# Patient Record
Sex: Female | Born: 1945 | Race: White | Hispanic: No | Marital: Married | Smoking: Never smoker
Health system: Southern US, Community
[De-identification: ages and names within clinical notes are randomized; demographics above are authoritative.]

## PROBLEM LIST (undated history)

## (undated) DIAGNOSIS — E7800 Pure hypercholesterolemia, unspecified: Secondary | ICD-10-CM

## (undated) DIAGNOSIS — M4300 Spondylolysis, site unspecified: Secondary | ICD-10-CM

## (undated) DIAGNOSIS — G96191 Perineural cyst: Secondary | ICD-10-CM

## (undated) DIAGNOSIS — E039 Hypothyroidism, unspecified: Secondary | ICD-10-CM

## (undated) DIAGNOSIS — M8580 Other specified disorders of bone density and structure, unspecified site: Secondary | ICD-10-CM

## (undated) DIAGNOSIS — E669 Obesity, unspecified: Secondary | ICD-10-CM

## (undated) DIAGNOSIS — R7303 Prediabetes: Secondary | ICD-10-CM

## (undated) DIAGNOSIS — N281 Cyst of kidney, acquired: Secondary | ICD-10-CM

## (undated) DIAGNOSIS — Z872 Personal history of diseases of the skin and subcutaneous tissue: Secondary | ICD-10-CM

## (undated) HISTORY — PX: APPENDECTOMY: SHX54

## (undated) HISTORY — PX: CYSTECTOMY: SUR359

## (undated) HISTORY — PX: ABDOMINAL HYSTERECTOMY: SHX81

---

## 2005-04-02 ENCOUNTER — Ambulatory Visit: Payer: Self-pay | Admitting: Internal Medicine

## 2006-04-06 ENCOUNTER — Ambulatory Visit: Payer: Self-pay | Admitting: Internal Medicine

## 2007-04-11 ENCOUNTER — Ambulatory Visit: Payer: Self-pay | Admitting: Internal Medicine

## 2007-09-28 ENCOUNTER — Ambulatory Visit: Payer: Self-pay | Admitting: Gastroenterology

## 2008-06-20 ENCOUNTER — Ambulatory Visit: Payer: Self-pay | Admitting: Internal Medicine

## 2009-11-25 ENCOUNTER — Ambulatory Visit: Payer: Self-pay | Admitting: Internal Medicine

## 2010-12-31 ENCOUNTER — Ambulatory Visit: Payer: Self-pay | Admitting: Internal Medicine

## 2012-02-16 ENCOUNTER — Ambulatory Visit: Payer: Self-pay | Admitting: Internal Medicine

## 2013-02-16 ENCOUNTER — Ambulatory Visit: Payer: Self-pay | Admitting: Internal Medicine

## 2013-11-13 ENCOUNTER — Ambulatory Visit: Payer: Self-pay

## 2014-04-03 ENCOUNTER — Ambulatory Visit: Payer: Self-pay | Admitting: Internal Medicine

## 2014-06-20 ENCOUNTER — Other Ambulatory Visit: Payer: Self-pay | Admitting: Internal Medicine

## 2014-06-20 DIAGNOSIS — Z1231 Encounter for screening mammogram for malignant neoplasm of breast: Secondary | ICD-10-CM

## 2015-04-08 ENCOUNTER — Ambulatory Visit: Payer: Self-pay | Attending: Internal Medicine

## 2015-10-15 ENCOUNTER — Other Ambulatory Visit: Payer: Self-pay | Admitting: Internal Medicine

## 2015-10-15 DIAGNOSIS — Z1231 Encounter for screening mammogram for malignant neoplasm of breast: Secondary | ICD-10-CM

## 2015-10-24 ENCOUNTER — Ambulatory Visit
Admission: RE | Admit: 2015-10-24 | Discharge: 2015-10-24 | Disposition: A | Discharge status: Home / Self Care | Payer: Medicare Other | Source: Ambulatory Visit | Attending: Internal Medicine | Admitting: Internal Medicine

## 2015-10-24 ENCOUNTER — Other Ambulatory Visit: Payer: Self-pay | Admitting: Internal Medicine

## 2015-10-24 DIAGNOSIS — Z1231 Encounter for screening mammogram for malignant neoplasm of breast: Secondary | ICD-10-CM

## 2016-10-15 ENCOUNTER — Other Ambulatory Visit: Payer: Self-pay | Admitting: Internal Medicine

## 2016-10-15 DIAGNOSIS — Z1231 Encounter for screening mammogram for malignant neoplasm of breast: Secondary | ICD-10-CM

## 2016-11-10 ENCOUNTER — Encounter (INDEPENDENT_AMBULATORY_CARE_PROVIDER_SITE_OTHER): Payer: Self-pay | Admitting: Vascular Surgery

## 2016-11-10 ENCOUNTER — Ambulatory Visit (INDEPENDENT_AMBULATORY_CARE_PROVIDER_SITE_OTHER): Payer: Medicare Other | Admitting: Vascular Surgery

## 2016-11-10 VITALS — BP 115/62 | HR 56 | Resp 16 | Ht 65.0 in | Wt 143.4 lb

## 2016-11-10 DIAGNOSIS — M7989 Other specified soft tissue disorders: Secondary | ICD-10-CM | POA: Diagnosis not present

## 2016-11-10 DIAGNOSIS — I83813 Varicose veins of bilateral lower extremities with pain: Secondary | ICD-10-CM

## 2016-11-10 NOTE — Progress Notes (Signed)
Patient ID: Janice Webb, female   DOB: August 18, 1945, 71 y.o.   MRN: [id number]  Chief Complaint  Patient presents with  . Varicose Veins    HPI Janice Webb is a 71 y.o. female.  I am asked to see the patient by Dr. Caryl Comes for evaluation of varicose veins.  The patient presents with complaints of symptomatic varicosities of the lower extremities. The patient reports a long standing history of varicosities and they have become painful over time. There was no clear inciting event or causative factor that started the symptoms.  The right leg is more severly affected. The patient elevates the legs for relief. The pain is described as Stinging and burning particularly overlying the varicosities. The symptoms are generally most severe in the evening, particularly when they have been on their feet for long periods of time. Elevation has been used to try to improve the symptoms with limited success. The patient complains of increasingly noticeable swelling as an associated symptom. The patient has no previous history of deep venous thrombosis or superficial thrombophlebitis to their knowledge.     History reviewed. No pertinent past medical history.  Past Surgical History:  Procedure Laterality Date  . ABDOMINAL HYSTERECTOMY    . APPENDECTOMY    . CYSTECTOMY      Family History  Problem Relation Age of Onset  . Breast cancer Paternal Aunt 63  Father and sister with varicose veins  Social History Social History  Substance Use Topics  . Smoking status: Never Smoker  . Smokeless tobacco: Never Used  . Alcohol use No  No IV drug use  No Known Allergies  Current Outpatient Prescriptions  Medication Sig Dispense Refill  . Calcium Carbonate-Vitamin D 600-400 MG-UNIT tablet Take by mouth.    . Multiple Vitamin (MULTI-VITAMINS) TABS Take by mouth.     No current facility-administered medications for this visit.       REVIEW OF SYSTEMS (Negative unless  checked)  Constitutional: [] Weight loss  [] Fever  [] Chills Cardiac: [] Chest pain   [] Chest pressure   [] Palpitations   [] Shortness of breath when laying flat   [] Shortness of breath at rest   [] Shortness of breath with exertion. Vascular:  [] Pain in legs with walking   [] Pain in legs at rest   [] Pain in legs when laying flat   [] Claudication   [] Pain in feet when walking  [] Pain in feet at rest  [] Pain in feet when laying flat   [] History of DVT   [] Phlebitis   [x] Swelling in legs   [x] Varicose veins   [] Non-healing ulcers Pulmonary:   [] Uses home oxygen   [] Productive cough   [] Hemoptysis   [] Wheeze  [] COPD   [] Asthma Neurologic:  [] Dizziness  [] Blackouts   [] Seizures   [] History of stroke   [] History of TIA  [] Aphasia   [] Temporary blindness   [] Dysphagia   [] Weakness or numbness in arms   [] Weakness or numbness in legs Musculoskeletal:  [] Arthritis   [] Joint swelling   [] Joint pain   [] Low back pain Hematologic:  [] Easy bruising  [] Easy bleeding   [] Hypercoagulable state   [] Anemic  [] Hepatitis Gastrointestinal:  [] Blood in stool   [] Vomiting blood  [] Gastroesophageal reflux/heartburn   [] Abdominal pain Genitourinary:  [] Chronic kidney disease   [] Difficult urination  [] Frequent urination  [] Burning with urination   [] Hematuria Skin:  [] Rashes   [] Ulcers   [] Wounds Psychological:  [] History of anxiety   []  History of major depression.    Physical Exam BP  115/62 (BP Location: Right Arm)   Pulse (!) 56   Resp 16   Ht 5\' 5"  (1.651 m)   Wt 65 kg (143 lb 6.4 oz)   BMI 23.86 kg/m  Gen:  WD/WN, NAD. Appears younger than stated age Head: [place]/AT, No temporalis wasting.  Ear/Nose/Throat: Hearing grossly intact, dentition good Eyes: Sclera non-icteric. Conjunctiva clear Neck: Supple, no nuchal rigidity. Trachea midline Pulmonary:  Good air movement, no use of accessory muscles, respirations not labored.  Cardiac: RRR, No JVD Vascular: Varicosities extensive and measuring up to 4 mm in the right  lower extremity        Varicosities diffuse and measuring up to 3 mm in the left lower extremity Vessel Right Left  Radial Palpable Palpable                          PT Palpable Palpable  DP Palpable Palpable    Musculoskeletal: M/S 5/5 throughout.   1 + RLE edema.  Trace LLE edema Neurologic: Sensation grossly intact in extremities.  Symmetrical.  Speech is fluent.  Psychiatric: Judgment intact, Mood & affect appropriate for pt's clinical situation. Dermatologic: No rashes or ulcers noted.  No cellulitis or open wounds.    Radiology No results found.  Labs No results found for this or any previous visit (from the past 2160 hour(s)).  Assessment/Plan:  Swelling of limb Likely from venous disease  Varicose veins of leg with pain, bilateral   The patient has symptoms consistent with chronic venous insufficiency. We discussed the natural history and treatment options for venous disease. I recommended the regular use of 20 - 30 mm Hg compression stockings, and prescribed these today. I recommended leg elevation and anti-inflammatories as needed for pain. I have also recommended a complete venous duplex to assess the venous system for reflux or thrombotic issues. This can be done at the patient's convenience. I will see the patient back in 3 months to assess the response to conservative management, and determine further treatment options.     Leotis Pain 11/10/2016, 1:58 PM   This note was created with Dragon medical transcription system.  Any errors from dictation are unintentional.

## 2016-11-10 NOTE — Assessment & Plan Note (Signed)
Likely from venous disease 

## 2016-11-10 NOTE — Patient Instructions (Signed)
Varicose Veins Varicose veins are veins that have become enlarged and twisted. They are usually seen in the legs but can occur in other parts of the body as well. What are the causes? This condition is the result of valves in the veins not working properly. Valves in the veins help to return blood from the leg to the heart. If these valves are damaged, blood flows backward and backs up into the veins in the leg near the skin. This causes the veins to become larger. What increases the risk? People who are on their feet a lot, who are pregnant, or who are overweight are more likely to develop varicose veins. What are the signs or symptoms?  Bulging, twisted-appearing, bluish veins, most commonly found on the legs.  Leg pain or a feeling of heaviness. These symptoms may be worse at the end of the day.  Leg swelling.  Changes in skin color. How is this diagnosed? A health care provider can usually diagnose varicose veins by examining your legs. Your health care provider may also recommend an ultrasound of your leg veins. How is this treated? Most varicose veins can be treated at home.However, other treatments are available for people who have persistent symptoms or want to improve the cosmetic appearance of the varicose veins. These treatment options include:  Sclerotherapy. A solution is injected into the vein to close it off.  Laser treatment. A laser is used to heat the vein to close it off.  Radiofrequency vein ablation. An electrical current produced by radio waves is used to close off the vein.  Phlebectomy. The vein is surgically removed through small incisions made over the varicose vein.  Vein ligation and stripping. The vein is surgically removed through incisions made over the varicose vein after the vein has been tied (ligated). Follow these instructions at home:   Do not stand or sit in one position for long periods of time. Do not sit with your legs crossed. Rest with your  legs raised during the day.  Wear compression stockings as directed by your health care provider. These stockings help to prevent blood clots and reduce swelling in your legs.  Do not wear other tight, encircling garments around your legs, pelvis, or waist.  Walk as much as possible to increase blood flow.  Raise the foot of your bed at night with 2-inch blocks.  If you get a cut in the skin over the vein and the vein bleeds, lie down with your leg raised and press on it with a clean cloth until the bleeding stops. Then place a bandage (dressing) on the cut. See your health care provider if it continues to bleed. Contact a health care provider if:  The skin around your ankle starts to break down.  You have pain, redness, tenderness, or hard swelling in your leg over a vein.  You are uncomfortable because of leg pain. This information is not intended to replace advice given to you by your health care provider. Make sure you discuss any questions you have with your health care provider. Document Released: 11/05/2004 Document Revised: 07/04/2015 Document Reviewed: 07/30/2015 Elsevier Interactive Patient Education  2017 [employer]  

## 2016-11-25 ENCOUNTER — Ambulatory Visit
Admission: RE | Admit: 2016-11-25 | Discharge: 2016-11-25 | Disposition: A | Discharge status: Home / Self Care | Payer: Medicare Other | Source: Ambulatory Visit | Attending: Internal Medicine | Admitting: Internal Medicine

## 2016-11-25 DIAGNOSIS — Z1231 Encounter for screening mammogram for malignant neoplasm of breast: Secondary | ICD-10-CM | POA: Diagnosis not present

## 2017-02-17 ENCOUNTER — Encounter (INDEPENDENT_AMBULATORY_CARE_PROVIDER_SITE_OTHER): Payer: Self-pay | Admitting: Vascular Surgery

## 2017-02-17 ENCOUNTER — Ambulatory Visit (INDEPENDENT_AMBULATORY_CARE_PROVIDER_SITE_OTHER): Payer: Medicare Other

## 2017-02-17 ENCOUNTER — Ambulatory Visit (INDEPENDENT_AMBULATORY_CARE_PROVIDER_SITE_OTHER): Payer: Medicare Other | Admitting: Vascular Surgery

## 2017-02-17 VITALS — BP 147/55 | HR 60 | Resp 14 | Ht 65.0 in | Wt 145.0 lb

## 2017-02-17 DIAGNOSIS — I83813 Varicose veins of bilateral lower extremities with pain: Secondary | ICD-10-CM

## 2017-02-17 DIAGNOSIS — I890 Lymphedema, not elsewhere classified: Secondary | ICD-10-CM

## 2017-02-17 DIAGNOSIS — I872 Venous insufficiency (chronic) (peripheral): Secondary | ICD-10-CM | POA: Diagnosis not present

## 2017-02-17 NOTE — Progress Notes (Signed)
Subjective:    Patient ID: Janice Webb, female    DOB: 09-22-1945, 72 y.o.   MRN: [id number] Chief Complaint  Patient presents with  . Follow-up    3 month venous reflux   The patient presents for a 73-month follow-up in regard to her bilateral painful varicosities.  Number initial visit, the patient has been engaging in conservative therapy including wearing medical grade 1 compression stockings, elevating her legs and remaining active with minimal improvement in her symptoms requiring the use of over-the-counter anti-inflammatories.  The patient's symptoms have progressed to the point she is unable to function on a daily basis.  She feels that her symptoms are lifestyle limiting.  The patient underwent a bilateral lower extremity venous reflux exam which was notable for right lower extremity: Reflux noted in the great saphenous vein at the knee.  Left lower extremity: Reflux noted in the great saphenous vein at the groin, great saphenous vein at the knee and small saphenous vein at the level of the saphenous popliteal junction.  No evidence of deep vein or superficial vein thrombophlebitis.  The patient denies any fever, nausea or vomiting.   Review of Systems  Constitutional: Negative.   HENT: Negative.   Eyes: Negative.   Respiratory: Negative.   Cardiovascular:       Painful varicose veins Chronic venous insufficiency  Gastrointestinal: Negative.   Endocrine: Negative.   Genitourinary: Negative.   Musculoskeletal: Negative.   Skin: Negative.   Allergic/Immunologic: Negative.   Neurological: Negative.   Hematological: Negative.   Psychiatric/Behavioral: Negative.       Objective:   Physical Exam  Constitutional: She is oriented to person, place, and time. She appears well-developed and well-nourished. No distress.  HENT:  Head: Normocephalic and atraumatic.  Eyes: Conjunctivae are normal. Pupils are equal, round, and reactive to light.  Neck: Normal range of  motion.  Cardiovascular: Normal rate, regular rhythm, normal heart sounds and intact distal pulses.  Pulses:      Radial pulses are 2+ on the right side, and 2+ on the left side.       Dorsalis pedis pulses are 2+ on the right side, and 2+ on the left side.       Posterior tibial pulses are 2+ on the right side, and 2+ on the left side.  Pulmonary/Chest: Effort normal and breath sounds normal.  Musculoskeletal: Normal range of motion. She exhibits edema (Mild to moderate bilateral lower extremity edema noted.  Non-pitting).  Neurological: She is alert and oriented to person, place, and time.  Skin: She is not diaphoretic.  Mixture of greater than 1 cm and less than 1 cm scattered varicosities to the bilateral lower extremity.  There is no cellulitis.  There is no skin changes.  Psychiatric: She has a normal mood and affect. Her behavior is normal. Judgment and thought content normal.  Vitals reviewed.  BP (!) 147/55 (BP Location: Right Arm, Patient Position: Sitting)   Pulse 60   Resp 14   Ht 5\' 5"  (1.651 m)   Wt 145 lb (65.8 kg)   BMI 24.13 kg/m   No past medical history on file.  Social History   Socioeconomic History  . Marital status: Married    Spouse name: Not on file  . Number of children: Not on file  . Years of education: Not on file  . Highest education level: Not on file  Social Needs  . Financial resource strain: Not on file  . Food insecurity -  worry: Not on file  . Food insecurity - inability: Not on file  . Transportation needs - medical: Not on file  . Transportation needs - non-medical: Not on file  Occupational History  . Not on file  Tobacco Use  . Smoking status: Never Smoker  . Smokeless tobacco: Never Used  Substance and Sexual Activity  . Alcohol use: No  . Drug use: No  . Sexual activity: Not on file  Other Topics Concern  . Not on file  Social History Narrative  . Not on file   Past Surgical History:  Procedure Laterality Date  .  ABDOMINAL HYSTERECTOMY    . APPENDECTOMY    . CYSTECTOMY     Family History  Problem Relation Age of Onset  . Breast cancer Paternal Aunt 52   No Known Allergies     Assessment & Plan:  The patient presents for a 73-month follow-up in regard to her bilateral painful varicosities.  Since our initial visit, the patient has been engaging in conservative therapy including wearing medical grade 1 compression stockings, elevating her legs and remaining active with minimal improvement in her symptoms requiring the use of over-the-counter anti-inflammatories.  The patient's symptoms have progressed to the point she is unable to function on a daily basis.  She feels that her symptoms are lifestyle limiting.  The patient underwent a bilateral lower extremity venous reflux exam which was notable for right lower extremity: Reflux noted in the great saphenous vein at the knee.  Left lower extremity: Reflux noted in the great saphenous vein at the groin, great saphenous vein at the knee and small saphenous vein at the level of the saphenous popliteal junction.  No evidence of deep vein or superficial vein thrombophlebitis.  The patient denies any fever, nausea or vomiting.  1. Chronic venous insufficiency - New  Since our initial visit, the patient has been engaging in conservative therapy including wearing medical grade 1 compression stockings, elevating her legs and remaining active with minimal improvement in her symptoms requiring the use of over-the-counter anti-inflammatories.  She feels that her symptoms are lifestyle limiting.  The patient underwent a bilateral lower extremity venous reflux exam which was notable for right lower extremity: Reflux noted in the great saphenous vein at the knee.  Left lower extremity: Reflux noted in the great saphenous vein at the groin, great saphenous vein at the knee and small saphenous vein at the level of the saphenous popliteal junction.  The patient is likely to  benefit from endovenous laser ablation. I have discussed the risks and benefits of the procedure. The risks primarily include DVT, recanalization, bleeding, infection, and inability to gain access. I will applied to the patient's insurance The patient is to continue engaging in conservative therapy until I receive insurance approval.  2. Varicose veins of leg with pain, bilateral - Stable As above  3. Lymphedema - New The patient may be a candidate for a lymphedema pump in the future if conservative therapy and laser ablation does not improve her symptoms  Current Outpatient Medications on File Prior to Visit  Medication Sig Dispense Refill  . Calcium Carbonate-Vitamin D 600-400 MG-UNIT tablet Take by mouth.    . Multiple Vitamin (MULTI-VITAMINS) TABS Take by mouth.     No current facility-administered medications on file prior to visit.     There are no Patient Instructions on file for this visit. No Follow-up on file.   Shauntay Brunelli A Angelize Ryce, PA-C

## 2017-04-30 ENCOUNTER — Encounter (INDEPENDENT_AMBULATORY_CARE_PROVIDER_SITE_OTHER): Payer: Self-pay | Admitting: Vascular Surgery

## 2017-04-30 ENCOUNTER — Ambulatory Visit (INDEPENDENT_AMBULATORY_CARE_PROVIDER_SITE_OTHER): Payer: Medicare Other | Admitting: Vascular Surgery

## 2017-04-30 VITALS — BP 118/47 | HR 54 | Resp 16 | Ht 65.5 in | Wt 144.6 lb

## 2017-04-30 DIAGNOSIS — I83813 Varicose veins of bilateral lower extremities with pain: Secondary | ICD-10-CM

## 2017-04-30 NOTE — Progress Notes (Signed)
Varicose veins of leg with pain, bilateral     The patient's left lower extremity was sterilely prepped and draped. The ultrasound machine was used to visualize the saphenous vein throughout its course. A segment in the upper calf was selected for access. The saphenous vein was accessed without difficulty using ultrasound guidance with a micro puncture needle. A micro puncture wire and sheath were then placed. A 0.018 wire was placed beyond the saphenofemoral junction through the sheath and the micro puncture sheath was removed. The 65 cm sheath was then placed over the wire and the wire and dilator were removed. The laser fiber was placed through the sheath and its tip was placed approximately 4-5 cm below the saphenofemoral junction. Tumescent anesthesia was then created with a dilute lidocaine solution. Laser energy was then delivered with constant withdrawal of the sheath and laser fiber. Approximately 1356 Joules of energy were delivered over a length of 37 cm using the 1470 Hz VenaCure machine at [employer]. The SSV was visualized for possible ablation, but it was very small and not large enough for access or placement of a sheath, so this was not performed. Sterile dressings were placed. The patient tolerated the procedure well without complications.

## 2017-05-04 ENCOUNTER — Ambulatory Visit (INDEPENDENT_AMBULATORY_CARE_PROVIDER_SITE_OTHER): Payer: Medicare Other

## 2017-05-04 DIAGNOSIS — I83813 Varicose veins of bilateral lower extremities with pain: Secondary | ICD-10-CM | POA: Diagnosis not present

## 2017-05-21 ENCOUNTER — Other Ambulatory Visit (INDEPENDENT_AMBULATORY_CARE_PROVIDER_SITE_OTHER): Payer: Medicare Other | Admitting: Vascular Surgery

## 2017-05-25 ENCOUNTER — Encounter (INDEPENDENT_AMBULATORY_CARE_PROVIDER_SITE_OTHER): Payer: Medicare Other

## 2017-06-17 ENCOUNTER — Other Ambulatory Visit (INDEPENDENT_AMBULATORY_CARE_PROVIDER_SITE_OTHER): Payer: Self-pay | Admitting: Vascular Surgery

## 2017-06-17 DIAGNOSIS — I8393 Asymptomatic varicose veins of bilateral lower extremities: Secondary | ICD-10-CM

## 2017-06-18 ENCOUNTER — Ambulatory Visit (INDEPENDENT_AMBULATORY_CARE_PROVIDER_SITE_OTHER): Payer: Medicare Other | Admitting: Vascular Surgery

## 2017-06-18 ENCOUNTER — Encounter (INDEPENDENT_AMBULATORY_CARE_PROVIDER_SITE_OTHER): Payer: Self-pay | Admitting: Vascular Surgery

## 2017-06-18 VITALS — BP 106/57 | HR 54 | Resp 13 | Ht 65.0 in | Wt 146.0 lb

## 2017-06-18 DIAGNOSIS — I83812 Varicose veins of left lower extremities with pain: Secondary | ICD-10-CM | POA: Diagnosis not present

## 2017-06-18 DIAGNOSIS — I83813 Varicose veins of bilateral lower extremities with pain: Secondary | ICD-10-CM

## 2017-06-18 DIAGNOSIS — I83811 Varicose veins of right lower extremities with pain: Secondary | ICD-10-CM | POA: Diagnosis not present

## 2017-06-18 NOTE — Progress Notes (Signed)
Varicose veins of leg with pain, bilateral    The patient's right lower extremity was sterilely prepped and draped. The ultrasound machine was used to visualize the saphenous vein throughout its course. A segment in the upper calf was selected for access. The saphenous vein was accessed without difficulty using ultrasound guidance with a micro puncture needle. A micro puncture wire and sheath were then placed. A 0.018 wire was placed beyond the saphenofemoral junction through the sheath and the micro puncture sheath was removed. The 65 cm sheath was then placed over the wire and the wire and dilator were removed. The laser fiber was placed through the sheath and its tip was placed approximately 4 cm below the saphenofemoral junction. Tumescent anesthesia was then created with a dilute lidocaine solution. Laser energy was then delivered with constant withdrawal of the sheath and laser fiber. Approximately 1519 Joules of energy were delivered over a length of 41 cm using the 1470 Hz VenaCure machine at [employer]. Sterile dressings were placed. The patient tolerated the procedure well without complications.

## 2017-06-21 ENCOUNTER — Ambulatory Visit (INDEPENDENT_AMBULATORY_CARE_PROVIDER_SITE_OTHER): Payer: Medicare Other

## 2017-06-21 DIAGNOSIS — I8393 Asymptomatic varicose veins of bilateral lower extremities: Secondary | ICD-10-CM | POA: Diagnosis not present

## 2017-07-16 ENCOUNTER — Ambulatory Visit (INDEPENDENT_AMBULATORY_CARE_PROVIDER_SITE_OTHER): Payer: Medicare Other | Admitting: Vascular Surgery

## 2017-07-16 ENCOUNTER — Encounter (INDEPENDENT_AMBULATORY_CARE_PROVIDER_SITE_OTHER): Payer: Self-pay | Admitting: Vascular Surgery

## 2017-07-16 VITALS — BP 110/61 | HR 60 | Resp 16 | Ht 65.0 in | Wt 148.8 lb

## 2017-07-16 DIAGNOSIS — I83813 Varicose veins of bilateral lower extremities with pain: Secondary | ICD-10-CM | POA: Diagnosis not present

## 2017-07-16 DIAGNOSIS — I872 Venous insufficiency (chronic) (peripheral): Secondary | ICD-10-CM

## 2017-07-16 DIAGNOSIS — I890 Lymphedema, not elsewhere classified: Secondary | ICD-10-CM | POA: Diagnosis not present

## 2017-07-16 NOTE — Progress Notes (Signed)
Subjective:    Patient ID: Janice Webb, female    DOB: Jul 01, 1945, 72 y.o.   MRN: [id number] Chief Complaint  Patient presents with  . Follow-up    3-4wk post laser   The patient is now status post laser ablation to the bilateral great saphenous veins.  Both follow-up ultrasounds are notable for successfully ablated great saphenous veins.  The patient continues to engage in conservative therapy including wearing medical grade 1 compression socks, elevating her legs and remaining active.  The patient notes that the remaining varicosities noted to the bilateral legs continued to be painful.  Patient notes that her symptoms have progressed to the point that she is unable to function on a daily basis and feel that her symptoms have become lifestyle limiting.  Patient denies any claudication-like symptoms, rest pain or ulcerations of bilateral lower extremity. Patient denies any fever, nausea vomiting.  Review of Systems  Constitutional: Negative.   HENT: Negative.   Eyes: Negative.   Respiratory: Negative.   Cardiovascular:       Painful varicose veins  Gastrointestinal: Negative.   Endocrine: Negative.   Genitourinary: Negative.   Musculoskeletal: Negative.   Skin: Negative.   Allergic/Immunologic: Negative.   Neurological: Negative.   Hematological: Negative.   Psychiatric/Behavioral: Negative.       Objective:   Physical Exam  Constitutional: She is oriented to person, place, and time. She appears well-developed and well-nourished. No distress.  HENT:  Head: Normocephalic.  Right Ear: External ear normal.  Left Ear: External ear normal.  Eyes: Pupils are equal, round, and reactive to light. Conjunctivae and EOM are normal.  Neck: Normal range of motion.  Cardiovascular: Normal rate, regular rhythm, normal heart sounds and intact distal pulses.  Pulses:      Radial pulses are 2+ on the right side, and 2+ on the left side.       Dorsalis pedis pulses are 2+ on the  right side, and 2+ on the left side.       Posterior tibial pulses are 2+ on the right side, and 2+ on the left side.  Pulmonary/Chest: Effort normal and breath sounds normal.  Musculoskeletal: Normal range of motion.  Neurological: She is alert and oriented to person, place, and time.  Skin: Skin is warm and dry. She is not diaphoretic.  Diffuse greater than 1 cm less than 1 cm scattered varicosities noted to the bilateral legs.  Psychiatric: She has a normal mood and affect. Her behavior is normal. Judgment and thought content normal.  Vitals reviewed.  BP 110/61 (BP Location: Right Arm)   Pulse 60   Resp 16   Ht 5\' 5"  (1.651 m)   Wt 148 lb 12.8 oz (67.5 kg)   BMI 24.76 kg/m   No past medical history on file.  Social History   Socioeconomic History  . Marital status: Married    Spouse name: Not on file  . Number of children: Not on file  . Years of education: Not on file  . Highest education level: Not on file  Occupational History  . Not on file  Social Needs  . Financial resource strain: Not on file  . Food insecurity:    Worry: Not on file    Inability: Not on file  . Transportation needs:    Medical: Not on file    Non-medical: Not on file  Tobacco Use  . Smoking status: Never Smoker  . Smokeless tobacco: Never Used  Substance and Sexual  Activity  . Alcohol use: No  . Drug use: No  . Sexual activity: Not on file  Lifestyle  . Physical activity:    Days per week: Not on file    Minutes per session: Not on file  . Stress: Not on file  Relationships  . Social connections:    Talks on phone: Not on file    Gets together: Not on file    Attends religious service: Not on file    Active member of club or organization: Not on file    Attends meetings of clubs or organizations: Not on file    Relationship status: Not on file  . Intimate partner violence:    Fear of current or ex partner: Not on file    Emotionally abused: Not on file    Physically abused: Not  on file    Forced sexual activity: Not on file  Other Topics Concern  . Not on file  Social History Narrative  . Not on file   Past Surgical History:  Procedure Laterality Date  . ABDOMINAL HYSTERECTOMY    . APPENDECTOMY    . CYSTECTOMY     Family History  Problem Relation Age of Onset  . Breast cancer Paternal Aunt 61   No Known Allergies     Assessment & Plan:  The patient is now status post laser ablation to the bilateral great saphenous veins.  Both follow-up ultrasounds are notable for successfully ablated great saphenous veins.  The patient continues to engage in conservative therapy including wearing medical grade 1 compression socks, elevating her legs and remaining active.  The patient notes that the remaining varicosities noted to the bilateral legs continued to be painful.  Patient notes that her symptoms have progressed to the point that she is unable to function on a daily basis and feel that her symptoms have become lifestyle limiting.  Patient denies any claudication-like symptoms, rest pain or ulcerations of bilateral lower extremity. Patient denies any fever, nausea vomiting.  1. Chronic venous insufficiency - Stable The patient is status post bilateral great saphenous vein ablation Follow-up ultrasounds to the bilateral legs are notable for successfully ablated greater saphenous veins  2. Varicose veins of leg with pain, bilateral - Stable Evening the patient is status post bilateral lower extremity endovascular ablation to the great saphenous vein she is still experiencing painful residual varicosities. The patient has engage in conservative therapy for over 3 months including wearing medical grade 1 compression socks, elevating her legs and remaining active with minimal improvement in her symptoms. The patient feels that her symptoms have progressed to the point that she is unable to function this lifestyle limiting The patient would benefit from foam sclerotherapy  to the larger varicosities and saline sclerotherapy to the smaller ones I will apply to the patient's insurance  3. Lymphedema - Stable The patient may be a candidate for lymphedema pump in the future  Current Outpatient Medications on File Prior to Visit  Medication Sig Dispense Refill  . Calcium Carbonate-Vitamin D 600-400 MG-UNIT tablet Take by mouth.    . Multiple Vitamin (MULTI-VITAMINS) TABS Take by mouth.    . ALPRAZolam (XANAX) 0.5 MG tablet   0   No current facility-administered medications on file prior to visit.    There are no Patient Instructions on file for this visit. No follow-ups on file.  Damonie Ellenwood A Karissa Meenan, PA-C

## 2017-08-24 ENCOUNTER — Ambulatory Visit (INDEPENDENT_AMBULATORY_CARE_PROVIDER_SITE_OTHER): Payer: Medicare Other | Admitting: Vascular Surgery

## 2017-08-24 ENCOUNTER — Encounter (INDEPENDENT_AMBULATORY_CARE_PROVIDER_SITE_OTHER): Payer: Self-pay | Admitting: Vascular Surgery

## 2017-08-24 VITALS — BP 120/65 | HR 58 | Resp 14 | Ht 65.0 in | Wt 150.0 lb

## 2017-08-24 DIAGNOSIS — I83813 Varicose veins of bilateral lower extremities with pain: Secondary | ICD-10-CM

## 2017-08-24 DIAGNOSIS — I83812 Varicose veins of left lower extremities with pain: Secondary | ICD-10-CM | POA: Diagnosis not present

## 2017-08-24 NOTE — Progress Notes (Signed)
  Janice Webb is a 72 y.o.female who presents with painful varicose veins of the left leg  History reviewed. No pertinent past medical history.  Past Surgical History:  Procedure Laterality Date  . ABDOMINAL HYSTERECTOMY    . APPENDECTOMY    . CYSTECTOMY      Current Outpatient Medications  Medication Sig Dispense Refill  . ALPRAZolam (XANAX) 0.5 MG tablet   0  . Calcium Carbonate-Vitamin D 600-400 MG-UNIT tablet Take by mouth.    . Multiple Vitamin (MULTI-VITAMINS) TABS Take by mouth.     No current facility-administered medications for this visit.     No Known Allergies  Indication: Patient presents with symptomatic varicose veins of the left lower extremity.  Procedure: Foam sclerotherapy was performed on the left lower extremity. Using ultrasound guidance, 5 mL of foam Sotradecol was used to inject the varicosities of the left lower extremity. Compression wraps were placed. The patient tolerated the procedure well.

## 2017-09-14 ENCOUNTER — Encounter (INDEPENDENT_AMBULATORY_CARE_PROVIDER_SITE_OTHER): Payer: Self-pay | Admitting: Vascular Surgery

## 2017-09-14 ENCOUNTER — Ambulatory Visit (INDEPENDENT_AMBULATORY_CARE_PROVIDER_SITE_OTHER): Payer: Medicare Other | Admitting: Vascular Surgery

## 2017-09-14 VITALS — BP 124/72 | HR 67 | Resp 14 | Ht 66.0 in | Wt 152.0 lb

## 2017-09-14 DIAGNOSIS — I83811 Varicose veins of right lower extremities with pain: Secondary | ICD-10-CM

## 2017-09-14 DIAGNOSIS — I83813 Varicose veins of bilateral lower extremities with pain: Secondary | ICD-10-CM

## 2017-09-14 NOTE — Progress Notes (Signed)
Janice Webb is a 72 y.o.female who presents with painful varicose veins of the right leg  History reviewed. No pertinent past medical history.  Past Surgical History:  Procedure Laterality Date  . ABDOMINAL HYSTERECTOMY    . APPENDECTOMY    . CYSTECTOMY      Current Outpatient Medications  Medication Sig Dispense Refill  . ALPRAZolam (XANAX) 0.5 MG tablet   0  . Calcium Carbonate-Vitamin D 600-400 MG-UNIT tablet Take by mouth.    . Multiple Vitamin (MULTI-VITAMINS) TABS Take by mouth.     No current facility-administered medications for this visit.     No Known Allergies  Indication: Patient presents with symptomatic varicose veins of the right lower extremity.  Procedure: Foam sclerotherapy was performed on the right lower extremity. Using ultrasound guidance, 5 mL of foam Sotradecol was used to inject the varicosities of the right lower extremity. Compression wraps were placed. The patient tolerated the procedure well.

## 2017-10-12 ENCOUNTER — Encounter (INDEPENDENT_AMBULATORY_CARE_PROVIDER_SITE_OTHER): Payer: Self-pay | Admitting: Vascular Surgery

## 2017-10-12 ENCOUNTER — Ambulatory Visit (INDEPENDENT_AMBULATORY_CARE_PROVIDER_SITE_OTHER): Payer: Medicare Other | Admitting: Vascular Surgery

## 2017-10-12 VITALS — BP 122/61 | HR 72 | Resp 16 | Ht 66.0 in | Wt 152.6 lb

## 2017-10-12 DIAGNOSIS — I83811 Varicose veins of right lower extremities with pain: Secondary | ICD-10-CM

## 2017-10-12 DIAGNOSIS — I83813 Varicose veins of bilateral lower extremities with pain: Secondary | ICD-10-CM

## 2017-10-12 NOTE — Progress Notes (Signed)
Janice Webb is a 72 y.o.female who presents with painful varicose veins of the right leg  No past medical history on file.  Past Surgical History:  Procedure Laterality Date  . ABDOMINAL HYSTERECTOMY    . APPENDECTOMY    . CYSTECTOMY      Current Outpatient Medications  Medication Sig Dispense Refill  . ALPRAZolam (XANAX) 0.5 MG tablet   0  . Calcium Carbonate-Vitamin D 600-400 MG-UNIT tablet Take by mouth.    . Multiple Vitamin (MULTI-VITAMINS) TABS Take by mouth.     No current facility-administered medications for this visit.     No Known Allergies  Indication: Patient presents with symptomatic varicose veins of the right lower extremity.  Procedure: Foam sclerotherapy was performed on the right lower extremity. Using ultrasound guidance, 5 mL of foam Sotradecol was used to inject the varicosities of the right lower extremity. Compression wraps were placed. The patient tolerated the procedure well.

## 2017-10-19 ENCOUNTER — Other Ambulatory Visit: Payer: Self-pay | Admitting: Internal Medicine

## 2017-10-19 DIAGNOSIS — Z1231 Encounter for screening mammogram for malignant neoplasm of breast: Secondary | ICD-10-CM

## 2017-11-09 ENCOUNTER — Ambulatory Visit (INDEPENDENT_AMBULATORY_CARE_PROVIDER_SITE_OTHER): Payer: Medicare Other | Admitting: Vascular Surgery

## 2017-11-09 ENCOUNTER — Encounter (INDEPENDENT_AMBULATORY_CARE_PROVIDER_SITE_OTHER): Payer: Self-pay | Admitting: Vascular Surgery

## 2017-11-09 VITALS — BP 136/64 | HR 66 | Resp 18 | Ht 66.0 in | Wt 154.0 lb

## 2017-11-09 DIAGNOSIS — I83813 Varicose veins of bilateral lower extremities with pain: Secondary | ICD-10-CM | POA: Diagnosis not present

## 2017-11-09 DIAGNOSIS — I83893 Varicose veins of bilateral lower extremities with other complications: Secondary | ICD-10-CM

## 2017-11-09 NOTE — Progress Notes (Signed)
Varicose veins of bilateral  lower extremity with inflammation (454.1  I83.10) Current Plans   Indication: Patient presents with symptomatic varicose veins of the bilateral  lower extremity.   Procedure: Sclerotherapy using hypertonic saline mixed with 1% Lidocaine was performed on the bilateral lower extremity. Compression wraps were placed. The patient tolerated the procedure well. 

## 2017-11-29 ENCOUNTER — Ambulatory Visit
Admission: RE | Admit: 2017-11-29 | Discharge: 2017-11-29 | Disposition: A | Discharge status: Home / Self Care | Payer: Medicare Other | Source: Ambulatory Visit | Attending: Internal Medicine | Admitting: Internal Medicine

## 2017-11-29 DIAGNOSIS — Z1231 Encounter for screening mammogram for malignant neoplasm of breast: Secondary | ICD-10-CM | POA: Diagnosis present

## 2017-12-07 ENCOUNTER — Ambulatory Visit (INDEPENDENT_AMBULATORY_CARE_PROVIDER_SITE_OTHER): Payer: Medicare Other | Admitting: Vascular Surgery

## 2017-12-07 ENCOUNTER — Encounter (INDEPENDENT_AMBULATORY_CARE_PROVIDER_SITE_OTHER): Payer: Self-pay | Admitting: Vascular Surgery

## 2017-12-07 VITALS — BP 127/67 | HR 74 | Resp 16 | Ht 66.0 in | Wt 157.0 lb

## 2017-12-07 DIAGNOSIS — I83813 Varicose veins of bilateral lower extremities with pain: Secondary | ICD-10-CM | POA: Diagnosis not present

## 2017-12-07 NOTE — Progress Notes (Signed)
Varicose veins of bilateral  lower extremity with inflammation (454.1  I83.10) Current Plans   Indication: Patient presents with symptomatic varicose veins of the bilateral  lower extremity.   Procedure: Sclerotherapy using hypertonic saline mixed with 1% Lidocaine was performed on the bilateral lower extremity. Compression wraps were placed. The patient tolerated the procedure well. 

## 2018-01-11 ENCOUNTER — Encounter (INDEPENDENT_AMBULATORY_CARE_PROVIDER_SITE_OTHER): Payer: Self-pay | Admitting: Vascular Surgery

## 2018-01-11 ENCOUNTER — Ambulatory Visit (INDEPENDENT_AMBULATORY_CARE_PROVIDER_SITE_OTHER): Payer: Medicare Other | Admitting: Vascular Surgery

## 2018-01-11 VITALS — BP 122/71 | HR 64 | Resp 16 | Ht 66.0 in | Wt 154.0 lb

## 2018-01-11 DIAGNOSIS — I83812 Varicose veins of left lower extremities with pain: Secondary | ICD-10-CM

## 2018-01-11 DIAGNOSIS — I83811 Varicose veins of right lower extremities with pain: Secondary | ICD-10-CM

## 2018-01-11 DIAGNOSIS — I872 Venous insufficiency (chronic) (peripheral): Secondary | ICD-10-CM

## 2018-01-11 DIAGNOSIS — I83813 Varicose veins of bilateral lower extremities with pain: Secondary | ICD-10-CM

## 2018-01-11 NOTE — Progress Notes (Signed)
Varicose veins of bilateral  lower extremity with inflammation (454.1  I83.10) Current Plans   Indication: Patient presents with symptomatic varicose veins of the bilateral  lower extremity.   Procedure: Sclerotherapy using hypertonic saline mixed with 1% Lidocaine was performed on the bilateral lower extremity. Compression wraps were placed. The patient tolerated the procedure well. 

## 2018-01-12 ENCOUNTER — Ambulatory Visit (INDEPENDENT_AMBULATORY_CARE_PROVIDER_SITE_OTHER): Payer: Medicare Other | Admitting: Vascular Surgery

## 2018-01-29 ENCOUNTER — Ambulatory Visit
Admission: EM | Admit: 2018-01-29 | Discharge: 2018-01-29 | Disposition: A | Discharge status: Home / Self Care | Payer: Medicare Other | Attending: Family Medicine | Admitting: Family Medicine

## 2018-01-29 ENCOUNTER — Other Ambulatory Visit: Payer: Self-pay

## 2018-01-29 ENCOUNTER — Encounter: Payer: Self-pay | Admitting: Gynecology

## 2018-01-29 DIAGNOSIS — M25552 Pain in left hip: Secondary | ICD-10-CM

## 2018-01-29 DIAGNOSIS — M545 Low back pain, unspecified: Secondary | ICD-10-CM

## 2018-01-29 MED ORDER — PREDNISONE 10 MG PO TABS: ORAL_TABLET | ORAL | 0 refills | Status: DC

## 2018-01-29 MED ORDER — HYDROCODONE-ACETAMINOPHEN 5-325 MG PO TABS: 1.0000 | ORAL_TABLET | Freq: Three times a day (TID) | ORAL | 0 refills | Status: DC | PRN

## 2018-01-29 NOTE — ED Triage Notes (Signed)
Patient c/o pain in her left hip x 4 days. Per patient no injuries to her left hip.

## 2018-01-29 NOTE — Discharge Instructions (Signed)
Medication as prescribed.  Take care  Dr. Mekhi Sonn  

## 2018-01-30 NOTE — ED Provider Notes (Signed)
[facility]    CSN: [id number] Arrival date & time: 01/29/18  1138  History   Chief Complaint Chief Complaint  Patient presents with  . Hip Pain   HPI  72 year old female presents with the above complaint.  4-day history of "left hip pain".  Does not recall any fall or trauma.  Does note that she exercised the day prior.  She states that she was doing a lot of twisting.  Patient reports that her pain is located in the distal aspect of her buttock.  Severe.  Difficulty with ambulation.  No relieving factors.  Denies groin pain.  No reports of numbness or tingling.  No other reported symptoms.  No other complaints.  History reviewed as below. PMH: Patient Active Problem List   Diagnosis Date Noted  . Chronic venous insufficiency 02/17/2017  . Lymphedema 02/17/2017  . Swelling of limb 11/10/2016  . Varicose veins of leg with pain, bilateral 11/10/2016   Past Surgical History:  Procedure Laterality Date  . ABDOMINAL HYSTERECTOMY    . APPENDECTOMY    . CYSTECTOMY      OB History   No obstetric history on file.      Home Medications    Prior to Admission medications   Medication Sig Start Date End Date Taking? Authorizing Provider  Calcium Carbonate-Vitamin D 600-400 MG-UNIT tablet Take by mouth.   Yes [provider]  Multiple Vitamin (MULTI-VITAMINS) TABS Take by mouth.   Yes [provider]  ALPRAZolam Duanne Moron) 0.5 MG tablet  05/10/17   [provider]  HYDROcodone-acetaminophen (NORCO/VICODIN) 5-325 MG tablet Take 1 tablet by mouth every 8 (eight) hours as needed. 01/29/18   Coral Spikes, DO  predniSONE (DELTASONE) 10 MG tablet 50 mg daily x 2 days, then 40 mg daily x 2 days, then 30 mg daily x 2 days, then 20 mg daily x 2 days, then 10 mg daily x 2 days. 01/29/18   Coral Spikes, DO    Family History Family History  Problem Relation Age of Onset  . Breast cancer Paternal Aunt 76  . Breast cancer Cousin 13       mat cousin     Social History Social History   Tobacco Use  . Smoking status: Never Smoker  . Smokeless tobacco: Never Used  Substance Use Topics  . Alcohol use: No  . Drug use: No     Allergies   Patient has no known allergies.   Review of Systems Review of Systems  Constitutional: Negative.   Musculoskeletal: Positive for gait problem.       "Hip pain".   Physical Exam Triage Vital Signs ED Triage Vitals  Enc Vitals Group     BP 01/29/18 1211 (!) 136/52     Pulse Rate 01/29/18 1211 70     Resp 01/29/18 1211 16     Temp 01/29/18 1212 98.3 F (36.8 C)     Temp Source 01/29/18 1211 Oral     SpO2 01/29/18 1211 100 %     Weight 01/29/18 1210 152 lb (68.9 kg)     Height --      Head Circumference --      Peak Flow --      Pain Score 01/29/18 1210 9     Pain Loc --      Pain Edu? --      Excl. in [place]? --    Updated Vital Signs BP (!) 136/52 (BP Location: Left Arm)  Pulse 70   Temp 98.3 F (36.8 C)   Resp 16   Wt 68.9 kg   SpO2 100%   BMI 24.53 kg/m   Visual Acuity Right Eye Distance:   Left Eye Distance:   Bilateral Distance:    Right Eye Near:   Left Eye Near:    Bilateral Near:     Physical Exam Vitals signs and nursing note reviewed.  Constitutional:      General: She is not in acute distress.    Appearance: She is not ill-appearing or toxic-appearing.  HENT:     Head: Normocephalic and atraumatic.  Cardiovascular:     Rate and Rhythm: Normal rate and regular rhythm.  Pulmonary:     Effort: Pulmonary effort is normal.     Breath sounds: No wheezing, rhonchi or rales.  Musculoskeletal:     Comments: Right hip - Normal ROM.  No apparent trochanter tenderness.  Negative FADIR.   Neurological:     Mental Status: She is alert.  Psychiatric:        Mood and Affect: Mood normal.        Behavior: Behavior normal.    UC Treatments / Results  Labs (all labs ordered are listed, but only abnormal results are displayed) Labs Reviewed - No data to  display  EKG None  Radiology No results found.  Procedures Procedures (including critical care time)  Medications Ordered in UC Medications - No data to display  Initial Impression / Assessment and Plan / UC Course  I have reviewed the triage vital signs and the nursing notes.  Pertinent labs & imaging results that were available during my care of the patient were reviewed by me and considered in my medical decision making (see chart for details).    72 year old female presents with low back pain.  Treating with prednisone. PRN Vicodin for severe pain. [place] controlled substance database reviewed.  No concerns for abuse.  Final Clinical Impressions(s) / UC Diagnoses   Final diagnoses:  Acute left-sided low back pain, unspecified whether sciatica present     Discharge Instructions     Medication as prescribed.  Take care  Dr. Lacinda Axon    ED Prescriptions    Medication Sig Dispense Auth. Provider   predniSONE (DELTASONE) 10 MG tablet 50 mg daily x 2 days, then 40 mg daily x 2 days, then 30 mg daily x 2 days, then 20 mg daily x 2 days, then 10 mg daily x 2 days. 30 tablet Janyce Ellinger G, DO   HYDROcodone-acetaminophen (NORCO/VICODIN) 5-325 MG tablet Take 1 tablet by mouth every 8 (eight) hours as needed. 10 tablet Coral Spikes, DO     Controlled Substance Prescriptions [place] Controlled Substance Registry consulted? Yes, I have consulted the [place] Controlled Substances Registry for this patient, and feel the risk/benefit ratio today is favorable for proceeding with this prescription for a controlled substance.   Coral Spikes, [place] 01/30/18 [phone]

## 2018-02-15 ENCOUNTER — Ambulatory Visit: Admission: RE | Admit: 2018-02-15 | Payer: Medicare Other | Source: Home / Self Care | Admitting: Gastroenterology

## 2018-02-15 ENCOUNTER — Encounter: Admission: RE | Payer: Self-pay | Source: Home / Self Care

## 2018-02-15 SURGERY — COLONOSCOPY WITH PROPOFOL
Anesthesia: General

## 2018-02-23 ENCOUNTER — Ambulatory Visit (INDEPENDENT_AMBULATORY_CARE_PROVIDER_SITE_OTHER): Payer: Medicare Other | Admitting: Vascular Surgery

## 2018-05-21 IMAGING — MG MM DIGITAL SCREENING BILAT W/ TOMO W/ CAD
8 of 12 series · 8 of 28 positions shown · non-contrast
Comparison: Previous exam(s).

CLINICAL DATA: Screening.

EXAM:
2D DIGITAL SCREENING BILATERAL MAMMOGRAM WITH CAD AND ADJUNCT TOMO

[R CC]
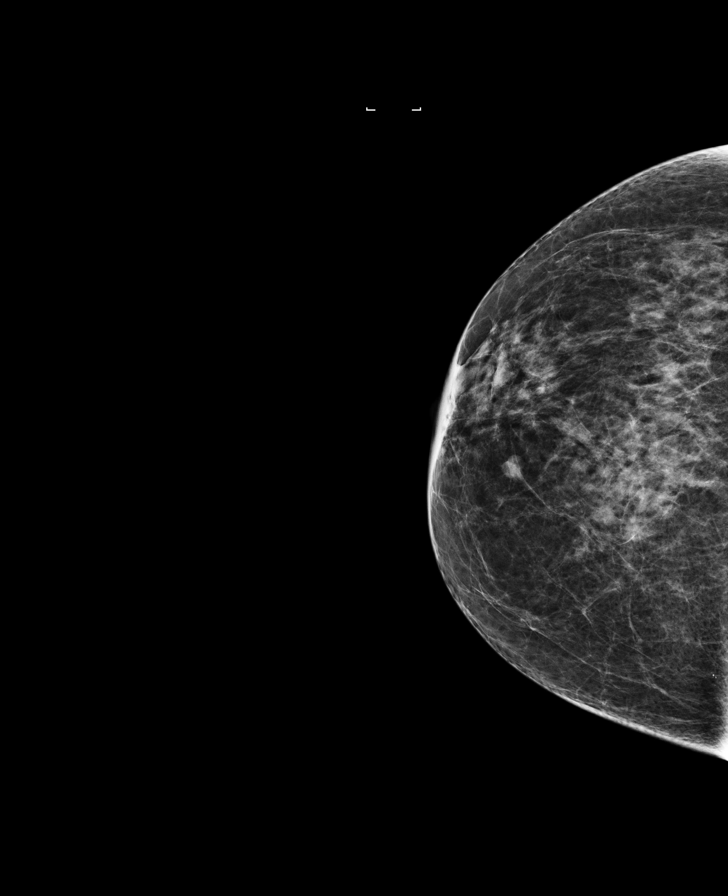

[L MLO synth-2D]
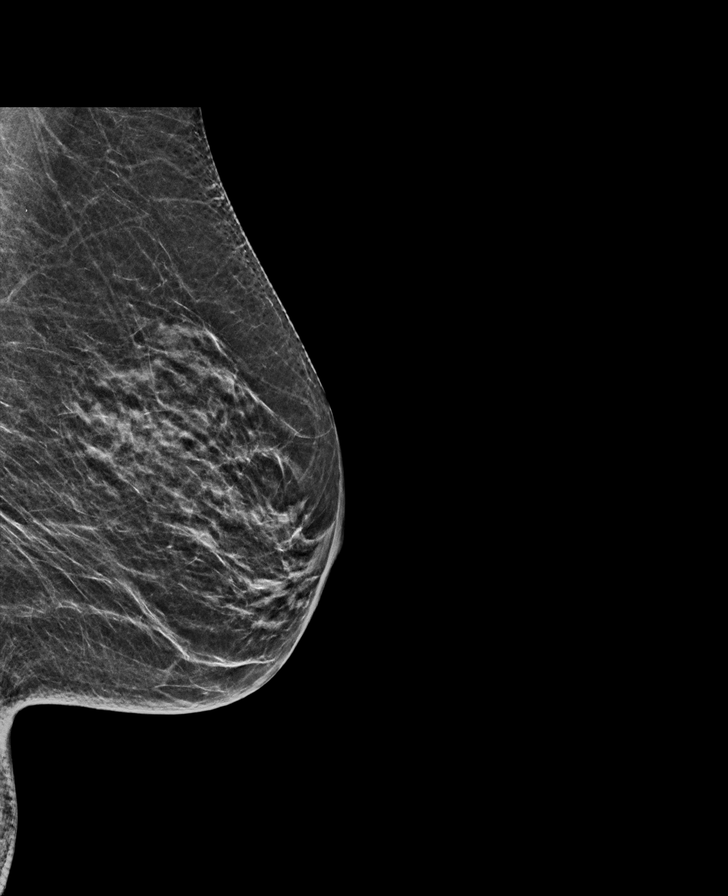

[R MLO synth-2D]
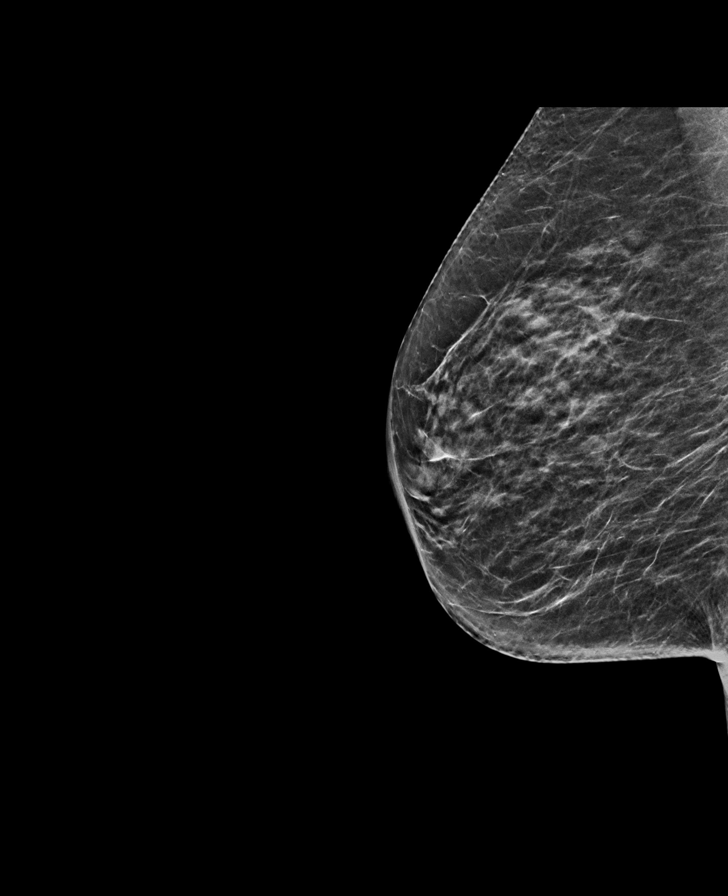

[L CC]
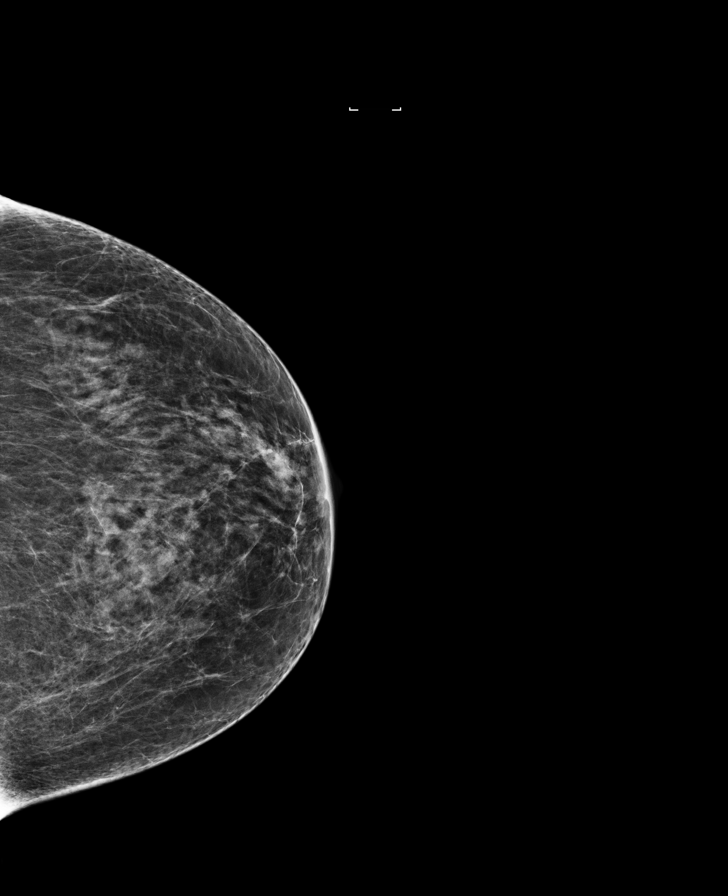

[L CC synth-2D]
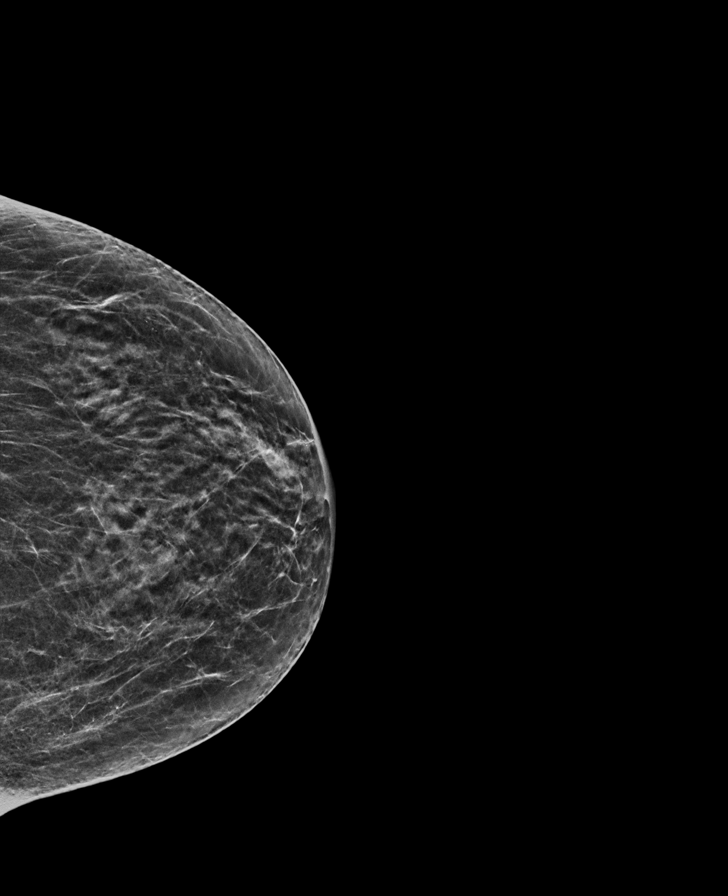

[R CC synth-2D]
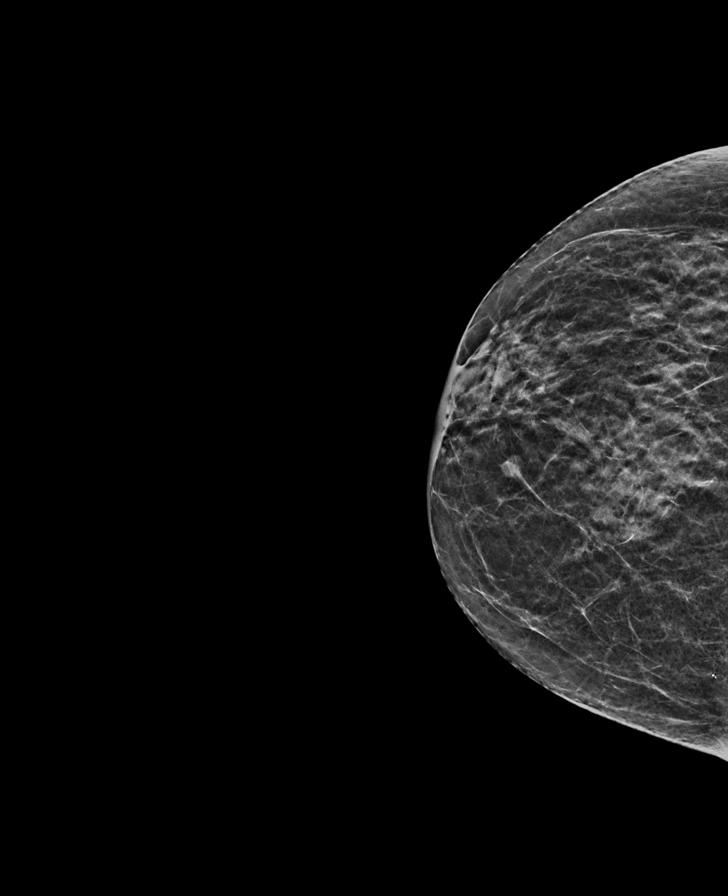

[L MLO]
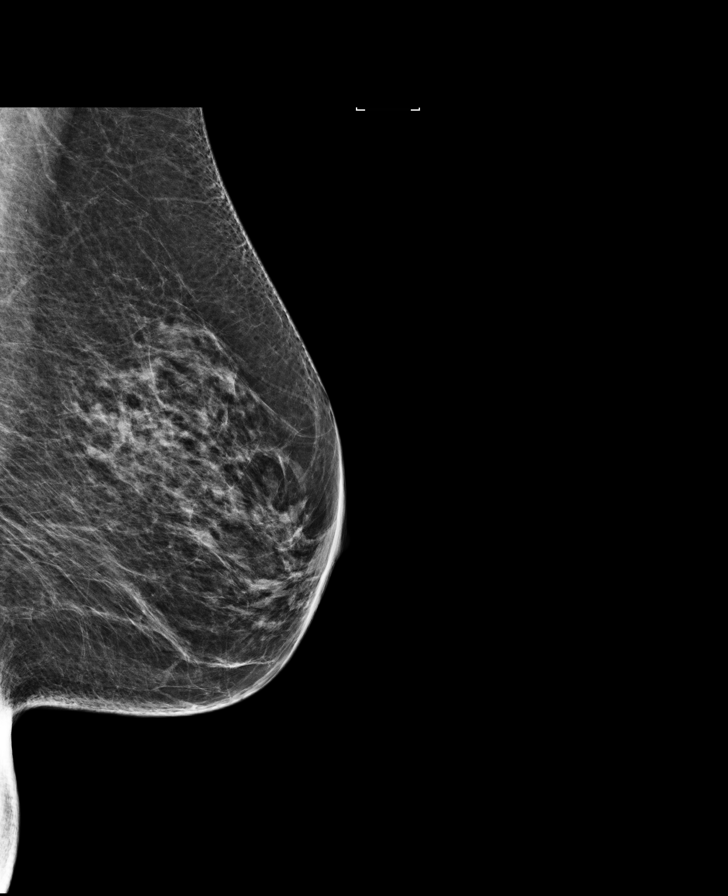

[R MLO]
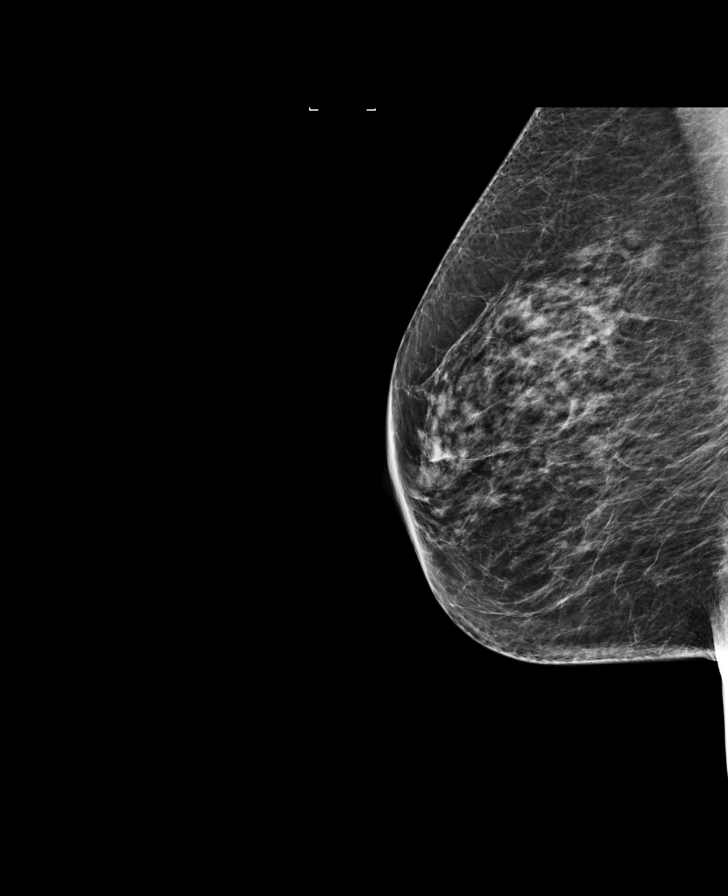

[8 of 28 positions shown; findings below may reference images not displayed]

ACR Breast Density Category c: The breast tissue is heterogeneously
dense, which may obscure small masses.
FINDINGS: There are no findings suspicious for malignancy. Images were
processed with CAD.
IMPRESSION: No mammographic evidence of malignancy. A result letter of this
screening mammogram will be mailed directly to the patient.

RECOMMENDATION:
Screening mammogram in one year. (Code:TN-[id number])

BI-RADS CATEGORY  1: Negative.

## 2018-08-18 ENCOUNTER — Other Ambulatory Visit: Payer: Self-pay

## 2018-08-18 ENCOUNTER — Other Ambulatory Visit
Admission: RE | Admit: 2018-08-18 | Discharge: 2018-08-18 | Disposition: A | Discharge status: Home / Self Care | Payer: Medicare Other | Source: Ambulatory Visit | Attending: Gastroenterology | Admitting: Gastroenterology

## 2018-08-18 DIAGNOSIS — Z1159 Encounter for screening for other viral diseases: Secondary | ICD-10-CM | POA: Insufficient documentation

## 2018-08-18 DIAGNOSIS — Z01812 Encounter for preprocedural laboratory examination: Secondary | ICD-10-CM | POA: Diagnosis present

## 2018-08-19 LAB — SARS CORONAVIRUS 2 (TAT 6-24 HRS): SARS Coronavirus 2: NEGATIVE

## 2018-08-22 ENCOUNTER — Encounter: Payer: Self-pay | Admitting: *Deleted

## 2018-08-22 ENCOUNTER — Encounter
Admission: RE | Disposition: A | Discharge status: Home / Self Care | Payer: Self-pay | Source: Home / Self Care | Attending: Gastroenterology

## 2018-08-22 ENCOUNTER — Ambulatory Visit: Payer: Medicare Other | Admitting: Anesthesiology

## 2018-08-22 ENCOUNTER — Other Ambulatory Visit: Payer: Self-pay

## 2018-08-22 ENCOUNTER — Ambulatory Visit
Admission: RE | Admit: 2018-08-22 | Discharge: 2018-08-22 | Disposition: A | Discharge status: Home / Self Care | Payer: Medicare Other | Attending: Gastroenterology | Admitting: Gastroenterology

## 2018-08-22 DIAGNOSIS — G709 Myoneural disorder, unspecified: Secondary | ICD-10-CM | POA: Diagnosis not present

## 2018-08-22 DIAGNOSIS — D120 Benign neoplasm of cecum: Secondary | ICD-10-CM | POA: Insufficient documentation

## 2018-08-22 DIAGNOSIS — Z79899 Other long term (current) drug therapy: Secondary | ICD-10-CM | POA: Diagnosis not present

## 2018-08-22 DIAGNOSIS — Z86010 Personal history of colonic polyps: Secondary | ICD-10-CM | POA: Insufficient documentation

## 2018-08-22 DIAGNOSIS — K5730 Diverticulosis of large intestine without perforation or abscess without bleeding: Secondary | ICD-10-CM | POA: Diagnosis not present

## 2018-08-22 DIAGNOSIS — M8580 Other specified disorders of bone density and structure, unspecified site: Secondary | ICD-10-CM | POA: Diagnosis not present

## 2018-08-22 DIAGNOSIS — Z1211 Encounter for screening for malignant neoplasm of colon: Secondary | ICD-10-CM | POA: Insufficient documentation

## 2018-08-22 DIAGNOSIS — M4300 Spondylolysis, site unspecified: Secondary | ICD-10-CM | POA: Diagnosis not present

## 2018-08-22 HISTORY — DX: Perineural cyst: G96.191

## 2018-08-22 HISTORY — DX: Pure hypercholesterolemia, unspecified: E78.00

## 2018-08-22 HISTORY — DX: Other specified disorders of bone density and structure, unspecified site: M85.80

## 2018-08-22 HISTORY — DX: Cyst of kidney, acquired: N28.1

## 2018-08-22 HISTORY — DX: Personal history of diseases of the skin and subcutaneous tissue: Z87.2

## 2018-08-22 HISTORY — DX: Spondylolysis, site unspecified: M43.00

## 2018-08-22 HISTORY — PX: COLONOSCOPY WITH PROPOFOL: SHX5780

## 2018-08-22 SURGERY — COLONOSCOPY WITH PROPOFOL
Anesthesia: General

## 2018-08-22 MED ORDER — PROPOFOL 500 MG/50ML IV EMUL
INTRAVENOUS | Status: DC | PRN
  Administered 2018-08-22: 120 ug/kg/min via INTRAVENOUS

## 2018-08-22 MED ORDER — PROPOFOL 10 MG/ML IV BOLUS
INTRAVENOUS | Status: DC | PRN
  Administered 2018-08-22: 50 mg via INTRAVENOUS

## 2018-08-22 MED ORDER — PROPOFOL 500 MG/50ML IV EMUL
INTRAVENOUS | Status: AC
  Filled 2018-08-22: qty 50

## 2018-08-22 MED ORDER — SODIUM CHLORIDE 0.9 % IV SOLN
INTRAVENOUS | Status: DC
  Administered 2018-08-22: 09:00:00 1000 mL via INTRAVENOUS

## 2018-08-22 NOTE — Anesthesia Postprocedure Evaluation (Signed)
Anesthesia Post Note  Patient: Truly Stankiewicz  Procedure(s) Performed: COLONOSCOPY WITH PROPOFOL (N/A )  Patient location during evaluation: Endoscopy Anesthesia Type: General Level of consciousness: awake and alert Pain management: pain level controlled Vital Signs Assessment: post-procedure vital signs reviewed and stable Respiratory status: spontaneous breathing, nonlabored ventilation, respiratory function stable and patient connected to nasal cannula oxygen Cardiovascular status: blood pressure returned to baseline and stable Postop Assessment: no apparent nausea or vomiting Anesthetic complications: no     Last Vitals:  Vitals:   08/22/18 0959 08/22/18 1009  BP: (!) 124/56 (!) 158/52  Pulse: (!) 51 (!) 50  Resp: 10 20  Temp:    SpO2: 100% 100%    Last Pain:  Vitals:   08/22/18 1009  TempSrc:   PainSc: 0-No pain                 Precious Haws Piscitello

## 2018-08-22 NOTE — Op Note (Signed)
[facility] Patient Name: Janice Webb Procedure Date: 08/22/2018 9:24 AM MRN: [id number] Account #: [id number] Date of Birth: 06-02-1945 Admit Type: Outpatient Age: 73 Room: [facility] ENDO ROOM 3 Gender: Female Note Status: Finalized Procedure:            Colonoscopy Indications:          Personal history of colonic polyps Providers:            Lollie Sails, MD Referring MD:         Ramonita Lab, MD (Referring MD) Medicines:            Monitored Anesthesia Care Complications:        No immediate complications. Procedure:            Pre-Anesthesia Assessment:                       - ASA Grade Assessment: III - A patient with severe                        systemic disease.                       After obtaining informed consent, the colonoscope was                        passed under direct vision. Throughout the procedure,                        the patient's blood pressure, pulse, and oxygen                        saturations were monitored continuously. The                        Colonoscope was introduced through the anus and                        advanced to the the cecum, identified by appendiceal                        orifice and ileocecal valve. The colonoscopy was                        performed without difficulty. The patient tolerated the                        procedure well. The quality of the bowel preparation                        was good. Findings:      Multiple small to medium-mouthed diverticula were found in the sigmoid       colon, transverse colon and ascending colon.      A 2 mm polyp was found in the cecum. The polyp was sessile. The polyp       was removed with a cold biopsy forceps. Resection and retrieval were       complete.      The exam was otherwise normal throughout the examined colon.      The digital rectal exam was normal. Impression:           - Diverticulosis in the  sigmoid colon, in the        transverse colon and in the ascending colon.                       - One 2 mm polyp in the cecum, removed with a cold                        biopsy forceps. Resected and retrieved. Recommendation:       - Discharge patient to home.                       - Advance diet as tolerated. Procedure Code(s):    --- Professional ---                       [phone], Colonoscopy, flexible; with biopsy, single or                        multiple Diagnosis Code(s):    --- Professional ---                       K63.5, Polyp of colon                       Z86.010, Personal history of colonic polyps                       K57.30, Diverticulosis of large intestine without                        perforation or abscess without bleeding CPT copyright 2019 American Medical Association. All rights reserved. The codes documented in this report are preliminary and upon coder review may  be revised to meet current compliance requirements. Lollie Sails, MD 08/22/2018 9:48:44 AM This report has been signed electronically. Number of Addenda: 0 Note Initiated On: 08/22/2018 9:24 AM Scope Withdrawal Time: 0 hours 7 minutes 23 seconds  Total Procedure Duration: 0 hours 14 minutes 25 seconds       [facility]

## 2018-08-22 NOTE — Anesthesia Preprocedure Evaluation (Signed)
Anesthesia Evaluation  Patient identified by MRN, date of birth, ID band Patient awake    Reviewed: Allergy & Precautions, H&P , NPO status , Patient's Chart, lab work & pertinent test results  History of Anesthesia Complications Negative for: history of anesthetic complications  Airway Mallampati: III  TM Distance: <3 FB Neck ROM: limited    Dental  (+) Chipped   Pulmonary neg pulmonary ROS, neg shortness of breath,           Cardiovascular Exercise Tolerance: Good (-) angina(-) Past MI and (-) DOE negative cardio ROS       Neuro/Psych  Neuromuscular disease negative psych ROS   GI/Hepatic negative GI ROS, Neg liver ROS, neg GERD  ,  Endo/Other  negative endocrine ROS  Renal/GU Renal disease  negative genitourinary   Musculoskeletal   Abdominal   Peds  Hematology negative hematology ROS (+)   Anesthesia Other Findings Past Medical History: No date: Elevated cholesterol No date: H/O pilonidal cyst No date: Osteopenia No date: Renal cyst, left 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  Past Surgical History: No date: ABDOMINAL HYSTERECTOMY No date: APPENDECTOMY No date: CYSTECTOMY  BMI    Body Mass Index: 25.40 kg/m      Reproductive/Obstetrics negative OB ROS                             Anesthesia Physical Anesthesia Plan  ASA: III  Anesthesia Plan: General   Post-op Pain Management:    Induction: Intravenous  PONV Risk Score and Plan: Propofol infusion and TIVA  Airway Management Planned: Natural Airway and Nasal Cannula  Additional Equipment:   Intra-op Plan:   Post-operative Plan:   Informed Consent: I have reviewed the patients History and Physical, chart, labs and discussed the procedure including the risks, benefits and alternatives for the proposed anesthesia with the patient or authorized representative who has indicated his/her understanding and  acceptance.     Dental Advisory Given  Plan Discussed with: Anesthesiologist, CRNA and Surgeon  Anesthesia Plan Comments: (Patient consented for risks of anesthesia including but not limited to:  - adverse reactions to medications - risk of intubation if required - damage to teeth, lips or other oral mucosa - sore throat or hoarseness - Damage to heart, brain, lungs or loss of life  Patient voiced understanding.)        Anesthesia Quick Evaluation

## 2018-08-22 NOTE — Transfer of Care (Signed)
Immediate Anesthesia Transfer of Care Note  Patient: Bertie Mcconathy  Procedure(s) Performed: COLONOSCOPY WITH PROPOFOL (N/A )  Patient Location: PACU  Anesthesia Type:General  Level of Consciousness: awake, alert  and oriented  Airway & Oxygen Therapy: Patient Spontanous Breathing and Patient connected to nasal cannula oxygen  Post-op Assessment: Report given to RN and Post -op Vital signs reviewed and stable  Post vital signs: Reviewed and stable  Last Vitals:  Vitals Value Taken Time  BP 118/47 08/22/18 0952  Temp    Pulse 53 08/22/18 0953  Resp 13 08/22/18 0953  SpO2 100 % 08/22/18 0953  Vitals shown include unvalidated device data.  Last Pain:  Vitals:   08/22/18 0949  TempSrc: Tympanic  PainSc: 0-No pain         Complications: No apparent anesthesia complications

## 2018-08-22 NOTE — Anesthesia Post-op Follow-up Note (Signed)
Anesthesia QCDR form completed.        

## 2018-08-22 NOTE — H&P (Signed)
Outpatient short stay form Pre-procedure 08/22/2018 9:15 AM Lollie Sails MD  Primary Physician: Dr. Ramonita Lab  Reason for visit: Colonoscopy  History of present illness: Patient is a 73 year old female presenting today for colonoscopy in regards her personal history of adenomatous colon polyps.  However her last colonoscopy was in 2005 with a finding of an adenoma at that time.  She takes no aspirin or blood thinning agent.  She tolerated her prep well.    Current Facility-Administered Medications:  .  0.9 %  sodium chloride infusion, , Intravenous, Continuous, Lollie Sails, MD  Medications Prior to Admission  Medication Sig Dispense Refill Last Dose  . cholecalciferol (VITAMIN D3) 25 MCG (1000 UT) tablet Take 1,000 Units by mouth daily.     Marland Kitchen ALPRAZolam (XANAX) 0.5 MG tablet   0   . Calcium Carbonate-Vitamin D 600-400 MG-UNIT tablet Take by mouth.     Marland Kitchen HYDROcodone-acetaminophen (NORCO/VICODIN) 5-325 MG tablet Take 1 tablet by mouth every 8 (eight) hours as needed. 10 tablet 0   . Multiple Vitamin (MULTI-VITAMINS) TABS Take by mouth.     . predniSONE (DELTASONE) 10 MG tablet 50 mg daily x 2 days, then 40 mg daily x 2 days, then 30 mg daily x 2 days, then 20 mg daily x 2 days, then 10 mg daily x 2 days. 30 tablet 0      No Known Allergies   Past Medical History:  Diagnosis Date  . Elevated cholesterol   . H/O pilonidal cyst   . Osteopenia   . Renal cyst, left   . Spondylolysis   . Tarlov cyst     Review of systems:      Physical Exam    Heart and lungs: Regular rate and rhythm without rub or gallop lungs are bilaterally clear    HEENT: Normocephalic atraumatic eyes are anicteric    Other:    Pertinant exam for procedure: Soft nontender nondistended bowel sounds positive normoactive    Planned proceedures: Colonoscopy and indicated procedures. I have discussed the risks benefits and complications of procedures to include not limited to bleeding,  infection, perforation and the risk of sedation and the patient wishes to proceed.    Lollie Sails, MD Gastroenterology 08/22/2018  9:15 AM

## 2018-08-23 ENCOUNTER — Encounter: Payer: Self-pay | Admitting: Gastroenterology

## 2018-08-24 LAB — SURGICAL PATHOLOGY

## 2019-01-16 ENCOUNTER — Other Ambulatory Visit: Payer: Self-pay | Admitting: Internal Medicine

## 2019-01-16 DIAGNOSIS — Z1231 Encounter for screening mammogram for malignant neoplasm of breast: Secondary | ICD-10-CM

## 2019-01-23 ENCOUNTER — Other Ambulatory Visit: Payer: Self-pay

## 2019-01-23 ENCOUNTER — Ambulatory Visit
Admission: RE | Admit: 2019-01-23 | Discharge: 2019-01-23 | Disposition: A | Discharge status: Home / Self Care | Payer: Medicare Other | Source: Ambulatory Visit | Attending: Internal Medicine | Admitting: Internal Medicine

## 2019-01-23 DIAGNOSIS — Z1231 Encounter for screening mammogram for malignant neoplasm of breast: Secondary | ICD-10-CM | POA: Insufficient documentation

## 2019-12-11 ENCOUNTER — Other Ambulatory Visit: Payer: Self-pay | Admitting: Internal Medicine

## 2019-12-11 DIAGNOSIS — Z1231 Encounter for screening mammogram for malignant neoplasm of breast: Secondary | ICD-10-CM

## 2020-01-24 ENCOUNTER — Ambulatory Visit
Admission: RE | Admit: 2020-01-24 | Discharge: 2020-01-24 | Disposition: A | Discharge status: Home / Self Care | Payer: Medicare Other | Source: Ambulatory Visit | Attending: Internal Medicine | Admitting: Internal Medicine

## 2020-01-24 ENCOUNTER — Other Ambulatory Visit: Payer: Self-pay

## 2020-01-24 DIAGNOSIS — Z1231 Encounter for screening mammogram for malignant neoplasm of breast: Secondary | ICD-10-CM | POA: Diagnosis present

## 2020-12-10 ENCOUNTER — Other Ambulatory Visit: Payer: Self-pay | Admitting: Internal Medicine

## 2020-12-10 DIAGNOSIS — Z1231 Encounter for screening mammogram for malignant neoplasm of breast: Secondary | ICD-10-CM

## 2021-03-17 ENCOUNTER — Ambulatory Visit
Admission: RE | Admit: 2021-03-17 | Discharge: 2021-03-17 | Disposition: A | Discharge status: Home / Self Care | Payer: Medicare Other | Source: Ambulatory Visit | Attending: Internal Medicine | Admitting: Internal Medicine

## 2021-03-17 ENCOUNTER — Other Ambulatory Visit: Payer: Self-pay

## 2021-03-17 DIAGNOSIS — Z1231 Encounter for screening mammogram for malignant neoplasm of breast: Secondary | ICD-10-CM | POA: Diagnosis present

## 2022-02-20 ENCOUNTER — Other Ambulatory Visit: Payer: Self-pay | Admitting: Internal Medicine

## 2022-02-20 DIAGNOSIS — Z1231 Encounter for screening mammogram for malignant neoplasm of breast: Secondary | ICD-10-CM

## 2022-03-18 ENCOUNTER — Ambulatory Visit
Admission: RE | Admit: 2022-03-18 | Discharge: 2022-03-18 | Disposition: A | Discharge status: Home / Self Care | Payer: Medicare Other | Source: Ambulatory Visit | Attending: Internal Medicine | Admitting: Internal Medicine

## 2022-03-18 DIAGNOSIS — Z1231 Encounter for screening mammogram for malignant neoplasm of breast: Secondary | ICD-10-CM | POA: Diagnosis not present

## 2023-02-12 ENCOUNTER — Other Ambulatory Visit: Payer: Self-pay | Admitting: Internal Medicine

## 2023-02-12 DIAGNOSIS — Z1231 Encounter for screening mammogram for malignant neoplasm of breast: Secondary | ICD-10-CM

## 2023-03-22 ENCOUNTER — Ambulatory Visit
Admission: RE | Admit: 2023-03-22 | Discharge: 2023-03-22 | Disposition: A | Discharge status: Home / Self Care | Payer: Medicare Other | Source: Ambulatory Visit | Attending: Internal Medicine | Admitting: Internal Medicine

## 2023-03-22 DIAGNOSIS — Z1231 Encounter for screening mammogram for malignant neoplasm of breast: Secondary | ICD-10-CM | POA: Insufficient documentation

## 2023-12-23 ENCOUNTER — Encounter: Payer: Self-pay | Admitting: Ophthalmology

## 2023-12-23 NOTE — Anesthesia Preprocedure Evaluation (Addendum)
 Anesthesia Evaluation  Patient identified by MRN, date of birth, ID band Patient awake    Reviewed: Allergy & Precautions, H&P , NPO status , Patient's Chart, lab work & pertinent test results  Airway Mallampati: III  TM Distance: >3 FB Neck ROM: Full    Dental no notable dental hx. (+) Chipped, Caps Chipped right upper central incisor, has 9 caps/crowns, but none in front:   Pulmonary neg pulmonary ROS   Pulmonary exam normal breath sounds clear to auscultation       Cardiovascular negative cardio ROS Normal cardiovascular exam Rhythm:Regular Rate:Normal     Neuro/Psych  Neuromuscular disease negative neurological ROS  negative psych ROS   GI/Hepatic negative GI ROS, Neg liver ROS,,,  Endo/Other  negative endocrine ROSHypothyroidism    Renal/GU Renal diseasenegative Renal ROS  negative genitourinary   Musculoskeletal negative musculoskeletal ROS (+)    Abdominal   Peds negative pediatric ROS (+)  Hematology negative hematology ROS (+)   Anesthesia Other Findings Elevated cholesterol  Renal cyst, left Osteopenia  Tarlov cyst Spondylolysis  H/O pilonidal cyst Hypothyroidism  Pre-diabetes    Reproductive/Obstetrics negative OB ROS                              Anesthesia Physical Anesthesia Plan  ASA: 2  Anesthesia Plan: MAC   Post-op Pain Management:    Induction: Intravenous  PONV Risk Score and Plan:   Airway Management Planned: Natural Airway and Nasal Cannula  Additional Equipment:   Intra-op Plan:   Post-operative Plan:   Informed Consent: I have reviewed the patients History and Physical, chart, labs and discussed the procedure including the risks, benefits and alternatives for the proposed anesthesia with the patient or authorized representative who has indicated his/her understanding and acceptance.     Dental Advisory Given  Plan Discussed with:  Anesthesiologist, CRNA and Surgeon  Anesthesia Plan Comments: (Patient consented for risks of anesthesia including but not limited to:  - adverse reactions to medications - damage to eyes, teeth, lips or other oral mucosa - nerve damage due to positioning  - sore throat or hoarseness - Damage to heart, brain, nerves, lungs, other parts of body or loss of life  Patient voiced understanding and assent.)         Anesthesia Quick Evaluation

## 2023-12-24 NOTE — Discharge Instructions (Signed)

## 2023-12-27 ENCOUNTER — Ambulatory Visit: Payer: Self-pay | Admitting: Anesthesiology

## 2023-12-27 ENCOUNTER — Encounter: Payer: Self-pay | Admitting: Ophthalmology

## 2023-12-27 ENCOUNTER — Encounter
Admission: RE | Disposition: A | Discharge status: Home / Self Care | Payer: Self-pay | Source: Home / Self Care | Attending: Ophthalmology

## 2023-12-27 ENCOUNTER — Other Ambulatory Visit: Payer: Self-pay

## 2023-12-27 ENCOUNTER — Ambulatory Visit
Admission: RE | Admit: 2023-12-27 | Discharge: 2023-12-27 | Disposition: A | Discharge status: Home / Self Care | Attending: Ophthalmology | Admitting: Ophthalmology

## 2023-12-27 DIAGNOSIS — N289 Disorder of kidney and ureter, unspecified: Secondary | ICD-10-CM | POA: Diagnosis not present

## 2023-12-27 DIAGNOSIS — G709 Myoneural disorder, unspecified: Secondary | ICD-10-CM | POA: Diagnosis not present

## 2023-12-27 DIAGNOSIS — E039 Hypothyroidism, unspecified: Secondary | ICD-10-CM | POA: Insufficient documentation

## 2023-12-27 DIAGNOSIS — H2511 Age-related nuclear cataract, right eye: Secondary | ICD-10-CM | POA: Insufficient documentation

## 2023-12-27 HISTORY — DX: Obesity, unspecified: E66.9

## 2023-12-27 HISTORY — PX: CATARACT EXTRACTION W/PHACO: SHX586

## 2023-12-27 HISTORY — DX: Prediabetes: R73.03

## 2023-12-27 HISTORY — DX: Hypothyroidism, unspecified: E03.9

## 2023-12-27 SURGERY — PHACOEMULSIFICATION, CATARACT, WITH IOL INSERTION
Anesthesia: Monitor Anesthesia Care | Laterality: Right

## 2023-12-27 MED ORDER — PHENYLEPHRINE HCL 10 % OP SOLN
OPHTHALMIC | Status: AC
  Filled 2023-12-27: qty 5

## 2023-12-27 MED ORDER — CYCLOPENTOLATE HCL 2 % OP SOLN
1.0000 [drp] | OPHTHALMIC | Status: DC | PRN
  Administered 2023-12-27 (×3): 1 [drp] via OPHTHALMIC

## 2023-12-27 MED ORDER — SIGHTPATH DOSE#1 BSS IO SOLN
INTRAOCULAR | Status: DC | PRN
  Administered 2023-12-27: 15 mL via INTRAOCULAR

## 2023-12-27 MED ORDER — TETRACAINE HCL 0.5 % OP SOLN
OPHTHALMIC | Status: AC
  Filled 2023-12-27: qty 4

## 2023-12-27 MED ORDER — SIGHTPATH DOSE#1 NA HYALUR & NA CHOND-NA HYALUR IO KIT
PACK | INTRAOCULAR | Status: DC | PRN
  Administered 2023-12-27: 1 via OPHTHALMIC

## 2023-12-27 MED ORDER — FENTANYL CITRATE (PF) 100 MCG/2ML IJ SOLN
INTRAMUSCULAR | Status: AC
  Filled 2023-12-27: qty 2

## 2023-12-27 MED ORDER — PHENYLEPHRINE HCL 10 % OP SOLN
1.0000 [drp] | OPHTHALMIC | Status: DC | PRN
  Administered 2023-12-27 (×3): 1 [drp] via OPHTHALMIC

## 2023-12-27 MED ORDER — FENTANYL CITRATE (PF) 100 MCG/2ML IJ SOLN
INTRAMUSCULAR | Status: DC | PRN
  Administered 2023-12-27: 50 ug via INTRAVENOUS

## 2023-12-27 MED ORDER — MOXIFLOXACIN HCL 0.5 % OP SOLN
OPHTHALMIC | Status: DC | PRN
  Administered 2023-12-27: .2 mL via OPHTHALMIC

## 2023-12-27 MED ORDER — EPINEPHRINE PF 1 MG/ML IJ SOLN
INTRAMUSCULAR | Status: DC | PRN
  Administered 2023-12-27: 89 mL via OPHTHALMIC

## 2023-12-27 MED ORDER — CYCLOPENTOLATE HCL 2 % OP SOLN
OPHTHALMIC | Status: AC
  Filled 2023-12-27: qty 2

## 2023-12-27 MED ORDER — MIDAZOLAM HCL 2 MG/2ML IJ SOLN
INTRAMUSCULAR | Status: AC
  Filled 2023-12-27: qty 2

## 2023-12-27 MED ORDER — TETRACAINE HCL 0.5 % OP SOLN
1.0000 [drp] | OPHTHALMIC | Status: DC | PRN
  Administered 2023-12-27 (×3): 1 [drp] via OPHTHALMIC

## 2023-12-27 MED ORDER — LACTATED RINGERS IV SOLN: INTRAVENOUS | Status: DC

## 2023-12-27 MED ORDER — MIDAZOLAM HCL (PF) 2 MG/2ML IJ SOLN
INTRAMUSCULAR | Status: DC | PRN
  Administered 2023-12-27: 2 mg via INTRAVENOUS

## 2023-12-27 MED ORDER — LIDOCAINE HCL (PF) 2 % IJ SOLN
INTRAMUSCULAR | Status: DC | PRN
  Administered 2023-12-27: 1 mL via INTRAOCULAR

## 2023-12-27 SURGICAL SUPPLY — 9 items
DISSECTOR HYDRO NUCLEUS 50X22 (MISCELLANEOUS) ×1 IMPLANT
FEE CATARACT SUITE SIGHTPATH (MISCELLANEOUS) ×1 IMPLANT
GLOVE PI ULTRA LF STRL 7.5 (GLOVE) ×1 IMPLANT
GLOVE SURG SYN 6.5 PF PI BL (GLOVE) ×1 IMPLANT
GLOVE SURG SYN 8.5 PF PI BL (GLOVE) ×1 IMPLANT
LENS IOL TECNIS EYHANCE 19.5 (Intraocular Lens) IMPLANT
NDL FILTER BLUNT 18X1 1/2 (NEEDLE) ×1 IMPLANT
NEEDLE FILTER BLUNT 18X1 1/2 (NEEDLE) ×1 IMPLANT
SYR 3ML LL SCALE MARK (SYRINGE) ×1 IMPLANT

## 2023-12-27 NOTE — Transfer of Care (Signed)
 Immediate Anesthesia Transfer of Care Note  Patient: Janice Webb  Procedure(s) Performed: PHACOEMULSIFICATION, CATARACT, WITH IOL INSERTION 6.69, 00:42.7 (Right)  Patient Location: PACU  Anesthesia Type: MAC  Level of Consciousness: awake, alert  and patient cooperative  Airway and Oxygen Therapy: Patient Spontanous Breathing   Post-op Assessment: Post-op Vital signs reviewed, Patient's Cardiovascular Status Stable, Respiratory Function Stable, Patent Airway and No signs of Nausea or vomiting  Post-op Vital Signs: Reviewed and stable  Complications: No notable events documented.

## 2023-12-27 NOTE — H&P (Signed)
 [facility]   Primary Care Physician:  Fernande Ophelia JINNY DOUGLAS, MD Ophthalmologist: Dr. Adine Novak  Pre-Procedure History & Physical: HPI:  Janice Webb is a 78 y.o. female here for cataract surgery.   Past Medical History:  Diagnosis Date   Elevated cholesterol    H/O pilonidal cyst    Hypothyroidism    Obesity (BMI 30-39.9)    Osteopenia    Pre-diabetes    Renal cyst, left    Spondylolysis    Tarlov cyst     Past Surgical History:  Procedure Laterality Date   ABDOMINAL HYSTERECTOMY     APPENDECTOMY     COLONOSCOPY WITH PROPOFOL  N/A 08/22/2018   Procedure: COLONOSCOPY WITH PROPOFOL ;  Surgeon: Gaylyn Gladis PENNER, MD;  Location: [facility] ENDOSCOPY;  Service: Endoscopy;  Laterality: N/A;   CYSTECTOMY      Prior to Admission medications   Medication Sig Start Date End Date Taking? Authorizing Provider  alendronate (FOSAMAX) 70 MG tablet Take 70 mg by mouth once a week. Take with a full glass of water on an empty stomach.   Yes [provider]  Calcium Carbonate-Vitamin D 600-400 MG-UNIT tablet Take by mouth.   Yes [provider]  cholecalciferol (VITAMIN D3) 25 MCG (1000 UT) tablet Take 1,000 Units by mouth daily.   Yes [provider]  levothyroxine (EUTHYROX) 25 MCG tablet Take 25 mcg by mouth daily before breakfast.   Yes [provider]    Allergies as of 11/26/2023   (No Known Allergies)    Family History  Problem Relation Age of Onset   Breast cancer Paternal Aunt 45   Breast cancer Cousin 42       mat cousin    Social History   Socioeconomic History   Marital status: Married    Spouse name: Not on file   Number of children: Not on file   Years of education: Not on file   Highest education level: Not on file  Occupational History   Not on file  Tobacco Use   Smoking status: Never   Smokeless tobacco: Never  Vaping Use   Vaping status: Never Used  Substance and Sexual Activity   Alcohol use: No   Drug  use: Never   Sexual activity: Not on file  Other Topics Concern   Not on file  Social History Narrative   Not on file   Social Drivers of Health   Financial Resource Strain: Low Risk  (04/23/2023)   Received from [facility]   Overall Financial Resource Strain (CARDIA)    Difficulty of Paying Living Expenses: Not hard at all  Food Insecurity: No Food Insecurity (04/23/2023)   Received from [facility]   Hunger Vital Sign    Within the past 12 months, you worried that your food would run out before you got the money to buy more.: Never true    Within the past 12 months, the food you bought just didn't last and you didn't have money to get more.: Never true  Transportation Needs: No Transportation Needs (04/23/2023)   Received from [facility] - Transportation    In the past 12 months, has lack of transportation kept you from medical appointments or from getting medications?: No    Lack of Transportation (Non-Medical): No  Physical Activity: Not on file  Stress: Not on file  Social Connections: Not on file  Intimate Partner Violence: Not on file  Review of Systems: See HPI, otherwise negative ROS  Physical Exam: BP (!) 172/60   Pulse 65   Temp (!) 97.3 F (36.3 C) (Temporal)   Resp 16   Ht 5' 4 (1.626 m)   Wt 86.2 kg   SpO2 100%   BMI 32.61 kg/m  General:   Alert, cooperative. Head:  Normocephalic and atraumatic. Respiratory:  Normal work of breathing. Cardiovascular:  NAD  Impression/Plan: Janice Webb is here for cataract surgery.  Risks, benefits, limitations, and alternatives regarding cataract surgery have been reviewed with the patient.  Questions have been answered.  All parties agreeable.   Adine Novak, MD  12/27/2023, 9:00 AM

## 2023-12-27 NOTE — Op Note (Signed)
 OPERATIVE NOTE  Janice Webb [id number] 12/27/2023   PREOPERATIVE DIAGNOSIS:  Nuclear sclerotic cataract right eye.  H25.11   POSTOPERATIVE DIAGNOSIS:    Nuclear sclerotic cataract right eye.     PROCEDURE:  Phacoemusification with posterior chamber intraocular lens placement of the right eye   LENS:   Implant Name Type Inv. Item Serial No. Manufacturer Lot No. LRB No. Used Action  LENS IOL TECNIS EYHANCE 19.5 - [id number] Intraocular Lens LENS IOL TECNIS EYHANCE 19.5 [id number] [employer]  Right 1 Implanted       Procedure(s): PHACOEMULSIFICATION, CATARACT, WITH IOL INSERTION 6.69, 00:42.7 (Right)  SURGEON:  Adine Novak, MD, MPH  ANESTHESIOLOGIST: Anesthesiologist: Ola Donny BROCKS, MD CRNA: Veronica Alm BROCKS, CRNA   ANESTHESIA:  Topical with tetracaine drops augmented with 1% preservative-free intracameral lidocaine.  ESTIMATED BLOOD LOSS: less than 1 mL.   COMPLICATIONS:  None.   DESCRIPTION OF PROCEDURE:  The patient was identified in the holding room and transported to the operating room and placed in the supine position under the operating microscope.  The right eye was identified as the operative eye and it was prepped and draped in the usual sterile ophthalmic fashion.   A 1.0 millimeter clear-corneal paracentesis was made at the 10:30 position. 0.5 ml of preservative-free 1% lidocaine with epinephrine was injected into the anterior chamber.  The anterior chamber was filled with viscoelastic.  A 2.4 millimeter keratome was used to make a near-clear corneal incision at the 8:00 position.  A curvilinear capsulorrhexis was made with a cystotome and capsulorrhexis forceps.  Balanced salt solution was used to hydrodissect and hydrodelineate the nucleus.   Phacoemulsification was then used in stop and chop fashion to remove the lens nucleus and epinucleus.  The remaining cortex was then removed using the irrigation and aspiration handpiece. Viscoelastic was then placed  into the capsular bag to distend it for lens placement.  A lens was then injected into the capsular bag.  The remaining viscoelastic was aspirated.   Wounds were hydrated with balanced salt solution.  The anterior chamber was inflated to a physiologic pressure with balanced salt solution.   Intracameral vigamox 0.1 mL undiluted was injected into the eye and a drop placed onto the ocular surface.  No wound leaks were noted.  The patient was taken to the recovery room in stable condition without complications of anesthesia or surgery  Adine Novak 12/27/2023, 9:26 AM

## 2023-12-27 NOTE — Anesthesia Postprocedure Evaluation (Signed)
 Anesthesia Post Note  Patient: Janice Webb  Procedure(s) Performed: PHACOEMULSIFICATION, CATARACT, WITH IOL INSERTION 6.69, 00:42.7 (Right)  Patient location during evaluation: PACU Anesthesia Type: MAC Level of consciousness: awake and alert Pain management: pain level controlled Vital Signs Assessment: post-procedure vital signs reviewed and stable Respiratory status: spontaneous breathing, nonlabored ventilation, respiratory function stable and patient connected to nasal cannula oxygen Cardiovascular status: stable and blood pressure returned to baseline Postop Assessment: no apparent nausea or vomiting Anesthetic complications: no   No notable events documented.   Last Vitals:  Vitals:   12/27/23 0930 12/27/23 0934  BP: (!) 144/66 132/63  Pulse: 63 (!) 58  Resp: 11 14  Temp:  (!) 36.3 C  SpO2: 98% 96%    Last Pain:  Vitals:   12/27/23 0934  TempSrc:   PainSc: 0-No pain                 Fortunata Betty C Kingstyn Deruiter

## 2023-12-30 NOTE — Discharge Instructions (Signed)

## 2024-01-03 NOTE — Anesthesia Preprocedure Evaluation (Addendum)
 Anesthesia Evaluation  Patient identified by MRN, date of birth, ID band Patient awake    Reviewed: Allergy & Precautions, H&P , NPO status , Patient's Chart, lab work & pertinent test results  Airway Mallampati: III  TM Distance: >3 FB Neck ROM: Full    Dental no notable dental hx. (+) Caps, Chipped Chipped, Caps Chipped right upper central incisor, has 9 caps/crowns, but none in front:  :   Pulmonary neg pulmonary ROS   Pulmonary exam normal breath sounds clear to auscultation       Cardiovascular negative cardio ROS Normal cardiovascular exam Rhythm:Regular Rate:Normal     Neuro/Psych  Neuromuscular disease negative neurological ROS  negative psych ROS   GI/Hepatic negative GI ROS, Neg liver ROS,,,  Endo/Other  negative endocrine ROSHypothyroidism    Renal/GU Renal diseasenegative Renal ROS  negative genitourinary   Musculoskeletal negative musculoskeletal ROS (+)    Abdominal   Peds negative pediatric ROS (+)  Hematology negative hematology ROS (+)   Anesthesia Other Findings Previous cataract surgery 12-27-23 Dr. Ola   Medical History  Elevated cholesterol Renal cyst, left Osteopenia Tarlov cyst Spondylolysis H/O pilonidal cyst Hypothyroidism Pre-diabetes Obesity (BMI 30-39.9)     Reproductive/Obstetrics negative OB ROS                              Anesthesia Physical Anesthesia Plan  ASA: 2  Anesthesia Plan: MAC   Post-op Pain Management:    Induction: Intravenous  PONV Risk Score and Plan:   Airway Management Planned: Natural Airway and Nasal Cannula  Additional Equipment:   Intra-op Plan:   Post-operative Plan:   Informed Consent: I have reviewed the patients History and Physical, chart, labs and discussed the procedure including the risks, benefits and alternatives for the proposed anesthesia with the patient or authorized representative who has  indicated his/her understanding and acceptance.     Dental Advisory Given  Plan Discussed with: Anesthesiologist, CRNA and Surgeon  Anesthesia Plan Comments: (Patient consented for risks of anesthesia including but not limited to:  - adverse reactions to medications - damage to eyes, teeth, lips or other oral mucosa - nerve damage due to positioning  - sore throat or hoarseness - Damage to heart, brain, nerves, lungs, other parts of body or loss of life  Patient voiced understanding and assent.)         Anesthesia Quick Evaluation

## 2024-01-10 ENCOUNTER — Ambulatory Visit: Payer: Self-pay | Admitting: Anesthesiology

## 2024-01-10 ENCOUNTER — Ambulatory Visit
Admission: RE | Admit: 2024-01-10 | Discharge: 2024-01-10 | Disposition: A | Discharge status: Home / Self Care | Attending: Ophthalmology | Admitting: Ophthalmology

## 2024-01-10 ENCOUNTER — Encounter
Admission: RE | Disposition: A | Discharge status: Home / Self Care | Payer: Self-pay | Source: Home / Self Care | Attending: Ophthalmology

## 2024-01-10 ENCOUNTER — Encounter: Payer: Self-pay | Admitting: Ophthalmology

## 2024-01-10 ENCOUNTER — Other Ambulatory Visit: Payer: Self-pay

## 2024-01-10 HISTORY — PX: CATARACT EXTRACTION W/PHACO: SHX586

## 2024-01-10 SURGERY — PHACOEMULSIFICATION, CATARACT, WITH IOL INSERTION
Anesthesia: Monitor Anesthesia Care | Laterality: Left

## 2024-01-10 MED ORDER — PHENYLEPHRINE HCL 10 % OP SOLN
OPHTHALMIC | Status: AC
  Filled 2024-01-10: qty 5

## 2024-01-10 MED ORDER — CYCLOPENTOLATE HCL 2 % OP SOLN
1.0000 [drp] | OPHTHALMIC | Status: AC
  Administered 2024-01-10 (×3): 1 [drp] via OPHTHALMIC

## 2024-01-10 MED ORDER — SIGHTPATH DOSE#1 BSS IO SOLN
INTRAOCULAR | Status: DC | PRN
  Administered 2024-01-10: 15 mL via INTRAOCULAR

## 2024-01-10 MED ORDER — FENTANYL CITRATE (PF) 100 MCG/2ML IJ SOLN
INTRAMUSCULAR | Status: AC
  Filled 2024-01-10: qty 2

## 2024-01-10 MED ORDER — MIDAZOLAM HCL 2 MG/2ML IJ SOLN
INTRAMUSCULAR | Status: AC
  Filled 2024-01-10: qty 2

## 2024-01-10 MED ORDER — DEXMEDETOMIDINE HCL IN NACL 200 MCG/50ML IV SOLN
INTRAVENOUS | Status: DC | PRN
  Administered 2024-01-10: 8 ug via INTRAVENOUS

## 2024-01-10 MED ORDER — SIGHTPATH DOSE#1 NA HYALUR & NA CHOND-NA HYALUR IO KIT
PACK | INTRAOCULAR | Status: DC | PRN
  Administered 2024-01-10: 1 via OPHTHALMIC

## 2024-01-10 MED ORDER — TETRACAINE HCL 0.5 % OP SOLN
1.0000 [drp] | OPHTHALMIC | Status: DC | PRN
  Administered 2024-01-10 (×3): 1 [drp] via OPHTHALMIC

## 2024-01-10 MED ORDER — TETRACAINE HCL 0.5 % OP SOLN
OPHTHALMIC | Status: AC
  Filled 2024-01-10: qty 4

## 2024-01-10 MED ORDER — DEXMEDETOMIDINE HCL IN NACL 80 MCG/20ML IV SOLN
INTRAVENOUS | Status: AC
  Filled 2024-01-10: qty 20

## 2024-01-10 MED ORDER — PHENYLEPHRINE HCL 10 % OP SOLN
1.0000 [drp] | OPHTHALMIC | Status: AC
  Administered 2024-01-10 (×3): 1 [drp] via OPHTHALMIC

## 2024-01-10 MED ORDER — SIGHTPATH DOSE#1 BSS IO SOLN
INTRAOCULAR | Status: DC | PRN
  Administered 2024-01-10: 64 mL via OPHTHALMIC

## 2024-01-10 MED ORDER — MIDAZOLAM HCL (PF) 2 MG/2ML IJ SOLN
INTRAMUSCULAR | Status: DC | PRN
  Administered 2024-01-10: 1 mg via INTRAVENOUS

## 2024-01-10 MED ORDER — LACTATED RINGERS IV SOLN: INTRAVENOUS | Status: DC

## 2024-01-10 MED ORDER — MOXIFLOXACIN HCL 0.5 % OP SOLN
OPHTHALMIC | Status: DC | PRN
  Administered 2024-01-10: .2 mL via OPHTHALMIC

## 2024-01-10 MED ORDER — LIDOCAINE HCL (PF) 2 % IJ SOLN
INTRAOCULAR | Status: DC | PRN
  Administered 2024-01-10: 4 mL via INTRAOCULAR

## 2024-01-10 MED ORDER — CYCLOPENTOLATE HCL 2 % OP SOLN
OPHTHALMIC | Status: AC
  Filled 2024-01-10: qty 2

## 2024-01-10 SURGICAL SUPPLY — 8 items
DISSECTOR HYDRO NUCLEUS 50X22 (MISCELLANEOUS) ×1 IMPLANT
FEE CATARACT SUITE SIGHTPATH (MISCELLANEOUS) ×1 IMPLANT
GLOVE PI ULTRA LF STRL 7.5 (GLOVE) ×1 IMPLANT
GLOVE SURG SYN 6.5 PF PI BL (GLOVE) ×1 IMPLANT
GLOVE SURG SYN 8.5 PF PI BL (GLOVE) ×1 IMPLANT
LENS IOL TECNIS EYHANCE 19.5 (Intraocular Lens) IMPLANT
NDL FILTER BLUNT 18X1 1/2 (NEEDLE) ×1 IMPLANT
SYR 3ML LL SCALE MARK (SYRINGE) ×1 IMPLANT

## 2024-01-10 NOTE — Op Note (Signed)
 OPERATIVE NOTE  Janice Webb [id number] 01/10/2024   PREOPERATIVE DIAGNOSIS:  Nuclear sclerotic cataract left eye.  H25.12   POSTOPERATIVE DIAGNOSIS:    Nuclear sclerotic cataract left eye.     PROCEDURE:  Phacoemusification with posterior chamber intraocular lens placement of the left eye   LENS:   Implant Name Type Inv. Item Serial No. Manufacturer Lot No. LRB No. Used Action  LENS IOL TECNIS EYHANCE 19.5 - [id number] Intraocular Lens LENS IOL TECNIS EYHANCE 19.5 [id number] [employer]  Left 1 Implanted      Procedure(s): PHACOEMULSIFICATION, CATARACT, WITH IOL INSERTION 4.83 00:29.2 (Left)  SURGEON:  Adine Novak, MD, MPH   ANESTHESIA:  Topical with tetracaine  drops augmented with 1% preservative-free intracameral lidocaine .  ESTIMATED BLOOD LOSS: <1 mL   COMPLICATIONS:  None.   DESCRIPTION OF PROCEDURE:  The patient was identified in the holding room and transported to the operating room and placed in the supine position under the operating microscope.  The left eye was identified as the operative eye and it was prepped and draped in the usual sterile ophthalmic fashion.   A 1.0 millimeter clear-corneal paracentesis was made at the 5:00 position. 0.5 ml of preservative-free 1% lidocaine  with epinephrine  was injected into the anterior chamber.  The anterior chamber was filled with viscoelastic.  A 2.4 millimeter keratome was used to make a near-clear corneal incision at the 2:00 position.  A curvilinear capsulorrhexis was made with a cystotome and capsulorrhexis forceps.  Balanced salt solution was used to hydrodissect and hydrodelineate the nucleus.   Phacoemulsification was then used in stop and chop fashion to remove the lens nucleus and epinucleus.  The remaining cortex was then removed using the irrigation and aspiration handpiece. Viscoelastic was then placed into the capsular bag to distend it for lens placement.  A lens was then injected into the capsular bag.  The  remaining viscoelastic was aspirated.   Wounds were hydrated with balanced salt solution.  The anterior chamber was inflated to a physiologic pressure with balanced salt solution.  Intracameral vigamox  0.1 mL undiltued was injected into the eye and a drop placed onto the ocular surface.  No wound leaks were noted.  The patient was taken to the recovery room in stable condition without complications of anesthesia or surgery  Adine Novak 01/10/2024, 9:06 AM

## 2024-01-10 NOTE — Anesthesia Postprocedure Evaluation (Signed)
 Anesthesia Post Note  Patient: Janice Webb  Procedure(s) Performed: PHACOEMULSIFICATION, CATARACT, WITH IOL INSERTION 4.83 00:29.2 (Left)  Patient location during evaluation: PACU Anesthesia Type: MAC Level of consciousness: awake and alert Pain management: pain level controlled Vital Signs Assessment: post-procedure vital signs reviewed and stable Respiratory status: spontaneous breathing, nonlabored ventilation, respiratory function stable and patient connected to nasal cannula oxygen Cardiovascular status: stable and blood pressure returned to baseline Postop Assessment: no apparent nausea or vomiting Anesthetic complications: no   No notable events documented.   Last Vitals:  Vitals:   01/10/24 0909 01/10/24 0913  BP: (!) 110/58 114/60  Pulse: (!) 59   Resp: 12   Temp: 36.7 C   SpO2: 98%     Last Pain:  Vitals:   01/10/24 0913  TempSrc:   PainSc: 0-No pain                 Deunta Beneke C Karee Forge

## 2024-01-10 NOTE — H&P (Signed)
 [facility]   Primary Care Physician:  Fernande Ophelia JINNY DOUGLAS, MD Ophthalmologist: Dr. Adine Novak  Pre-Procedure History & Physical: HPI:  Janice Webb is a 78 y.o. female here for cataract surgery.   Past Medical History:  Diagnosis Date   Elevated cholesterol    H/O pilonidal cyst    Hypothyroidism    Obesity (BMI 30-39.9)    Osteopenia    Pre-diabetes    Renal cyst, left    Spondylolysis    Tarlov cyst     Past Surgical History:  Procedure Laterality Date   ABDOMINAL HYSTERECTOMY     APPENDECTOMY     CATARACT EXTRACTION W/PHACO Right 12/27/2023   Procedure: PHACOEMULSIFICATION, CATARACT, WITH IOL INSERTION 6.69, 00:42.7;  Surgeon: Novak Adine Anes, MD;  [facility];  Service: Ophthalmology;  Laterality: Right;   COLONOSCOPY WITH PROPOFOL  N/A 08/22/2018   Procedure: COLONOSCOPY WITH PROPOFOL ;  Surgeon: Gaylyn Gladis PENNER, MD;  Location: [facility] ENDOSCOPY;  Service: Endoscopy;  Laterality: N/A;   CYSTECTOMY      Prior to Admission medications   Medication Sig Start Date End Date Taking? Authorizing Provider  alendronate (FOSAMAX) 70 MG tablet Take 70 mg by mouth once a week. Take with a full glass of water on an empty stomach.   Yes [provider]  Calcium Carbonate-Vitamin D 600-400 MG-UNIT tablet Take by mouth.   Yes [provider]  cholecalciferol (VITAMIN D3) 25 MCG (1000 UT) tablet Take 1,000 Units by mouth daily.   Yes [provider]  levothyroxine (EUTHYROX) 25 MCG tablet Take 25 mcg by mouth daily before breakfast.   Yes [provider]    Allergies as of 11/26/2023   (No Known Allergies)    Family History  Problem Relation Age of Onset   Breast cancer Paternal Aunt 36   Breast cancer Cousin 6       mat cousin    Social History   Socioeconomic History   Marital status: Married    Spouse name: Not on file   Number of children: Not on file   Years of education: Not on file   Highest  education level: Not on file  Occupational History   Not on file  Tobacco Use   Smoking status: Never   Smokeless tobacco: Never  Vaping Use   Vaping status: Never Used  Substance and Sexual Activity   Alcohol use: No   Drug use: Never   Sexual activity: Not on file  Other Topics Concern   Not on file  Social History Narrative   Not on file   Social Drivers of Health   Financial Resource Strain: Low Risk  (04/23/2023)   Received from [facility]   Overall Financial Resource Strain (CARDIA)    Difficulty of Paying Living Expenses: Not hard at all  Food Insecurity: No Food Insecurity (04/23/2023)   Received from [facility]   Hunger Vital Sign    Within the past 12 months, you worried that your food would run out before you got the money to buy more.: Never true    Within the past 12 months, the food you bought just didn't last and you didn't have money to get more.: Never true  Transportation Needs: No Transportation Needs (04/23/2023)   Received from [facility] - [facility]    In the past 12 months, has lack of transportation kept you from medical appointments or from getting medications?: No  Lack of Transportation (Non-Medical): No  Physical Activity: Not on file  Stress: Not on file  Social Connections: Not on file  Intimate Partner Violence: Not on file    Review of Systems: See HPI, otherwise negative ROS  Physical Exam: BP (!) 155/61   Pulse 60   Temp (!) 97.4 F (36.3 C) (Temporal)   Resp 10   Ht 5' 4 (1.626 m)   Wt 85.9 kg   SpO2 99%   BMI 32.51 kg/m  General:   Alert, cooperative. Head:  Normocephalic and atraumatic. Respiratory:  Normal work of breathing. Cardiovascular:  NAD  Impression/Plan: Janice Webb is here for cataract surgery.  Risks, benefits, limitations, and alternatives regarding cataract surgery have been reviewed with the patient.  Questions have been  answered.  All parties agreeable.   Adine Novak, MD  01/10/2024, 8:46 AM

## 2024-01-10 NOTE — Transfer of Care (Signed)
 Immediate Anesthesia Transfer of Care Note  Patient: Janice Webb  Procedure(s) Performed: PHACOEMULSIFICATION, CATARACT, WITH IOL INSERTION 4.83 00:29.2 (Left)  Patient Location: PACU  Anesthesia Type: MAC  Level of Consciousness: awake, alert  and patient cooperative  Airway and Oxygen Therapy: Patient Spontanous Breathing   Post-op Assessment: Post-op Vital signs reviewed, Patient's Cardiovascular Status Stable, Respiratory Function Stable, Patent Airway and No signs of Nausea or vomiting  Post-op Vital Signs: Reviewed and stable  Complications: No notable events documented.

## 2024-02-29 ENCOUNTER — Ambulatory Visit

## 2024-02-29 DIAGNOSIS — Z860101 Personal history of adenomatous and serrated colon polyps: Secondary | ICD-10-CM | POA: Diagnosis not present

## 2024-02-29 DIAGNOSIS — K635 Polyp of colon: Secondary | ICD-10-CM | POA: Diagnosis not present

## 2024-02-29 DIAGNOSIS — K5730 Diverticulosis of large intestine without perforation or abscess without bleeding: Secondary | ICD-10-CM | POA: Diagnosis not present

## 2024-02-29 DIAGNOSIS — Z09 Encounter for follow-up examination after completed treatment for conditions other than malignant neoplasm: Secondary | ICD-10-CM | POA: Diagnosis not present
